# Patient Record
Sex: Female | Born: 1970 | Race: White | Hispanic: No | Marital: Married | Smoking: Former smoker
Health system: Southern US, Community
[De-identification: ages and names within clinical notes are randomized; demographics above are authoritative.]

## PROBLEM LIST (undated history)

## (undated) DIAGNOSIS — J930 Spontaneous tension pneumothorax: Secondary | ICD-10-CM

## (undated) DIAGNOSIS — H539 Unspecified visual disturbance: Secondary | ICD-10-CM

## (undated) DIAGNOSIS — G629 Polyneuropathy, unspecified: Secondary | ICD-10-CM

## (undated) DIAGNOSIS — G259 Extrapyramidal and movement disorder, unspecified: Secondary | ICD-10-CM

## (undated) DIAGNOSIS — G35 Multiple sclerosis: Secondary | ICD-10-CM

## (undated) HISTORY — PX: PLEURAL SCARIFICATION: SHX748

## (undated) HISTORY — DX: Polyneuropathy, unspecified: G62.9

## (undated) HISTORY — DX: Unspecified visual disturbance: H53.9

## (undated) HISTORY — DX: Extrapyramidal and movement disorder, unspecified: G25.9

---

## 2013-10-19 ENCOUNTER — Emergency Department (HOSPITAL_BASED_OUTPATIENT_CLINIC_OR_DEPARTMENT_OTHER): Payer: Managed Care, Other (non HMO)

## 2013-10-19 ENCOUNTER — Encounter (HOSPITAL_BASED_OUTPATIENT_CLINIC_OR_DEPARTMENT_OTHER): Payer: Self-pay | Admitting: Emergency Medicine

## 2013-10-19 ENCOUNTER — Emergency Department (HOSPITAL_BASED_OUTPATIENT_CLINIC_OR_DEPARTMENT_OTHER)
Admission: EM | Admit: 2013-10-19 | Discharge: 2013-10-19 | Disposition: A | Discharge status: Home / Self Care | Payer: Managed Care, Other (non HMO) | Attending: Emergency Medicine | Admitting: Emergency Medicine

## 2013-10-19 DIAGNOSIS — Y990 Civilian activity done for income or pay: Secondary | ICD-10-CM | POA: Insufficient documentation

## 2013-10-19 DIAGNOSIS — S52131A Displaced fracture of neck of right radius, initial encounter for closed fracture: Secondary | ICD-10-CM

## 2013-10-19 DIAGNOSIS — S52133A Displaced fracture of neck of unspecified radius, initial encounter for closed fracture: Secondary | ICD-10-CM | POA: Insufficient documentation

## 2013-10-19 DIAGNOSIS — Y939 Activity, unspecified: Secondary | ICD-10-CM | POA: Insufficient documentation

## 2013-10-19 DIAGNOSIS — W010XXA Fall on same level from slipping, tripping and stumbling without subsequent striking against object, initial encounter: Secondary | ICD-10-CM | POA: Insufficient documentation

## 2013-10-19 DIAGNOSIS — Z79899 Other long term (current) drug therapy: Secondary | ICD-10-CM | POA: Insufficient documentation

## 2013-10-19 DIAGNOSIS — W19XXXA Unspecified fall, initial encounter: Secondary | ICD-10-CM

## 2013-10-19 DIAGNOSIS — Z8669 Personal history of other diseases of the nervous system and sense organs: Secondary | ICD-10-CM | POA: Insufficient documentation

## 2013-10-19 DIAGNOSIS — Y929 Unspecified place or not applicable: Secondary | ICD-10-CM | POA: Insufficient documentation

## 2013-10-19 HISTORY — DX: Multiple sclerosis: G35

## 2013-10-19 MED ORDER — OXYCODONE-ACETAMINOPHEN 5-325 MG PO TABS: 1.0000 | ORAL_TABLET | ORAL | Status: DC | PRN

## 2013-10-19 MED ORDER — IBUPROFEN 600 MG PO TABS: 600.0000 mg | ORAL_TABLET | Freq: Four times a day (QID) | ORAL | Status: DC | PRN

## 2013-10-19 NOTE — ED Provider Notes (Signed)
Medical screening examination/treatment/procedure(s) were performed by non-physician practitioner and as supervising physician I was immediately available for consultation/collaboration.  EKG Interpretation   None         Catharine Kettlewell, MD 10/19/13 2058 

## 2013-10-19 NOTE — ED Notes (Signed)
Katherine Cameron coming out of work states she has mobility issues due to MS thinks the foot drop caused her to trip and landed on right arm having pain from elbow down

## 2013-10-19 NOTE — ED Provider Notes (Signed)
CSN: [id number]     Arrival date & time 10/19/13  1647 History   First MD Initiated Contact with Patient 10/19/13 1706     Chief Complaint  Patient presents with  . right arm pain    (Consider location/radiation/quality/duration/timing/severity/associated sxs/prior Treatment) HPI Pt is a 42yo female with hx of MS c/o right arm pain after tripping at work earlier today with outstretched hands falling from ground level onto carpeted area. States she has fallen before and believes it is due to her foot drop from MS, causing her to trip and fall.  Reports pain is intermittent, no pain at rest but 10/10 pain that is aching and throbbing with movement at her elbow and making a fist with her hand.  Denies shoulder or hand pain. Denies any other injuries. Denies numbness or tingling.  Denies previous injury of same arm.  Denies hitting head or LOC.  Past Medical History  Diagnosis Date  . Multiple sclerosis    History reviewed. No pertinent past surgical history. History reviewed. No pertinent family history. History  Substance Use Topics  . Smoking status: Never Smoker   . Smokeless tobacco: Not on file  . Alcohol Use: No   OB History   Grav Para Term Preterm Abortions TAB SAB Ect Mult Living                 Review of Systems  Musculoskeletal: Positive for arthralgias and myalgias.       Right elbow  Skin: Negative for wound.  Neurological: Negative for weakness and numbness.  All other systems reviewed and are negative.    Allergies  Review of patient's allergies indicates no known allergies.  Home Medications   Current Outpatient Rx  Name  Route  Sig  Dispense  Refill  . buPROPion (WELLBUTRIN) 75 MG tablet   Oral   Take 75 mg by mouth 2 (two) times daily.         . DULoxetine (CYMBALTA) 20 MG capsule   Oral   Take 20 mg by mouth daily.         Marland Kitchen ibuprofen (ADVIL,MOTRIN) 600 MG tablet   Oral   Take 1 tablet (600 mg total) by mouth every 6 (six) hours as needed.  30 tablet   0   . oxyCODONE-acetaminophen (PERCOCET/ROXICET) 5-325 MG per tablet   Oral   Take 1-2 tablets by mouth every 4 (four) hours as needed for severe pain.   10 tablet   0    BP 126/79  Pulse 96  Temp(Src) 97.5 F (36.4 C) (Oral)  Resp 20  Ht 5\' 5"  (1.651 m)  Wt 125 lb (56.7 kg)  BMI 20.80 kg/m2  SpO2 100%  LMP 10/12/2013 Physical Exam  Nursing note and vitals reviewed. Constitutional: She is oriented to person, place, and time. She appears well-developed and well-nourished.  HENT:  Head: Normocephalic and atraumatic.  Eyes: EOM are normal.  Neck: Normal range of motion.  Cardiovascular: Normal rate.   Pulmonary/Chest: Effort normal.  Musculoskeletal: Normal range of motion. She exhibits tenderness ( lateral aspect right elbow). She exhibits no edema.  No edema or obvious deformity of right elbow. FROM.  Pain with supination and pronation or right arm. 4/5 grip strength right hand limited by pain. Tenderness along lateral aspect of right elbow. Radial pulse 2+ Normal sensation to light touch.  Neurological: She is alert and oriented to person, place, and time.  Skin: Skin is warm and dry.  Skin in tact. No ecchymosis  or erythema.  Psychiatric: She has a normal mood and affect. Her behavior is normal.    ED Course  Procedures (including critical care time) Labs Review Labs Reviewed - No data to display Imaging Review Dg Elbow Complete Right  10/19/2013   CLINICAL DATA:  Larey Seat.  Injured right elbow.  EXAM: RIGHT ELBOW - COMPLETE 3+ VIEW  COMPARISON:  None.  FINDINGS: There is a nondisplaced radial neck fracture with mild impaction. No other fractures are identified.  IMPRESSION: Slightly impacted radial neck fracture.   Electronically Signed   By: Loralie Champagne M.D.   On: 10/19/2013 17:26    EKG Interpretation   None       MDM   1. Radial neck fracture, right, closed, initial encounter   2. Fall, initial encounter    Pt c/o right arm pain after  tripping and falling from ground level onto carpeted area with outstretched hands.  Denies hitting head or LOC. No other injuries. Pain with palpation of lateral aspect of right elbow.  Pain with pronation and supination of right arm.  Neurovascularly in tact.  Plain films: slightly impacted radial neck fracture.  Will place pt in long arm splint and sling. F/u within 1 week with Dr. Janee Morn, orthopedics for further evaluation and treatment. Rx: percocet and ibuprofen.  Return precautions provided. Pt verbalized understanding and agreement with tx plan.     Junius Finner, PA-C 10/19/13 [phone]

## 2014-07-07 DIAGNOSIS — R269 Unspecified abnormalities of gait and mobility: Secondary | ICD-10-CM | POA: Insufficient documentation

## 2014-07-07 DIAGNOSIS — G890 Central pain syndrome: Secondary | ICD-10-CM | POA: Insufficient documentation

## 2014-07-07 DIAGNOSIS — F341 Dysthymic disorder: Secondary | ICD-10-CM | POA: Insufficient documentation

## 2014-08-28 DIAGNOSIS — H469 Unspecified optic neuritis: Secondary | ICD-10-CM | POA: Insufficient documentation

## 2014-08-28 DIAGNOSIS — G35 Multiple sclerosis: Secondary | ICD-10-CM | POA: Insufficient documentation

## 2014-08-28 DIAGNOSIS — M21379 Foot drop, unspecified foot: Secondary | ICD-10-CM | POA: Insufficient documentation

## 2014-08-28 DIAGNOSIS — R200 Anesthesia of skin: Secondary | ICD-10-CM | POA: Insufficient documentation

## 2014-08-28 DIAGNOSIS — M5137 Other intervertebral disc degeneration, lumbosacral region: Secondary | ICD-10-CM | POA: Insufficient documentation

## 2014-08-28 DIAGNOSIS — M47817 Spondylosis without myelopathy or radiculopathy, lumbosacral region: Secondary | ICD-10-CM | POA: Insufficient documentation

## 2014-08-28 DIAGNOSIS — G4700 Insomnia, unspecified: Secondary | ICD-10-CM | POA: Insufficient documentation

## 2014-08-28 DIAGNOSIS — M546 Pain in thoracic spine: Secondary | ICD-10-CM | POA: Insufficient documentation

## 2014-12-16 ENCOUNTER — Ambulatory Visit (INDEPENDENT_AMBULATORY_CARE_PROVIDER_SITE_OTHER): Payer: BLUE CROSS/BLUE SHIELD | Admitting: Neurology

## 2014-12-16 ENCOUNTER — Encounter: Payer: Self-pay | Admitting: Neurology

## 2014-12-16 VITALS — BP 116/82 | HR 86 | Resp 12 | Wt 118.8 lb

## 2014-12-16 DIAGNOSIS — G35 Multiple sclerosis: Secondary | ICD-10-CM

## 2014-12-16 DIAGNOSIS — R200 Anesthesia of skin: Secondary | ICD-10-CM

## 2014-12-16 DIAGNOSIS — R39198 Other difficulties with micturition: Secondary | ICD-10-CM | POA: Insufficient documentation

## 2014-12-16 DIAGNOSIS — F32A Depression, unspecified: Secondary | ICD-10-CM

## 2014-12-16 DIAGNOSIS — H469 Unspecified optic neuritis: Secondary | ICD-10-CM

## 2014-12-16 DIAGNOSIS — R269 Unspecified abnormalities of gait and mobility: Secondary | ICD-10-CM

## 2014-12-16 DIAGNOSIS — G890 Central pain syndrome: Secondary | ICD-10-CM

## 2014-12-16 DIAGNOSIS — F329 Major depressive disorder, single episode, unspecified: Secondary | ICD-10-CM

## 2014-12-16 MED ORDER — METHYLPHENIDATE HCL 20 MG PO TABS: 20.0000 mg | ORAL_TABLET | Freq: Two times a day (BID) | ORAL | Status: DC

## 2014-12-16 NOTE — Patient Instructions (Signed)

## 2014-12-16 NOTE — Progress Notes (Addendum)
[facility]  PATIENT: Katherine Cameron DOB: 10-Apr-1971  REFERRING CLINICIAN: Hedgecock, PA-C HISTORY FROM: Patient with input form husband  REASON FOR VISIT: MS and spastic gait   HISTORICAL  CHIEF COMPLAINT:  Chief Complaint  Patient presents with  . Multiple Sclerosis    F/U MS.  Sts. taking Tecfidera as rx.  She thinks walking/balance is worse--is ambulatory with a rolling walker.  Sts. fine motor skills some worse--writing is worse.  More trouble swallowing and occasional choking episode.  Sts. bowel and bladder urgency is about the same.     HISTORY OF PRESENT ILLNESS:  Katherine Cameron is a 44 year old woman who was diagnosed with MS at age 70 when she presented with optic neuritis on the right side. She went to an ophthalmologist and then was referred to neurology where she had an MRI of the brain performed. The MRI was consistent with MS. She did not need to have a lumbar puncture. This was before FDA approved medications and she had several courses of IV steroids. Around 1994, she started Avonex after a large exacerbation around 2008, she went on Tysabri. She was more stable on Tysabri. Unfortunately, she had a a high titer JCV antibody. She was on Tysabri for about 3 years and then was switched to Tecfidera. She has been on Tecfidera for about 3 years. During the transition from Tysabri to [place], she had an especially severe exacerbation with significant worsening of her gait. A new focus was noted in the cervical spine.  Currently, her main difficulty is poor gait. Her legs are spastic. The right leg is much worse than the left leg. She has been on Ampyra for a couple years and gets a benefit with improved gait when she takes it. Missing even one dose will worsen her gait.  Another major issue is poor bladder function. She has a combination of urinary hesitancy but also has urinary frequency and urgency. She has incontinence and wears Depends. She has not seen  urology.   Vesicare and tamsulosin only helped a little bit.  She also has had bowel incontinence.    She has not tried more Myrbetriq or bethanechol. However, at this time she would prefer to hold off on further treatment of the bladder.  One of her first symptoms was optic neuritis. She continues to have decreased vision out of the left but notes that it has worsened over the past few years.   Mood is doing better. She now is only on the imipramine at bedtime. She no longer takes duloxetine or bupropion. Fatigue continues to be a problem and builds as the day goes on.   She has occasional lassitude. She takes Ritalin 20 mg once and sometimes twice a day. She feels this has helped both the mood and fatigue.  She has noted some cognitive decline.   Focus is reduced and she finds the ritalin helps some.    Her STM is decreased.   She has trouble with verbal fluency.  Planning is difficult.     She has dysesthesias in both feet. In the past, she has also had a dysesthetic sensation in the trunk. This has improved. The dysesthesias in the feet are slightly better since starting imipramine.  REVIEW OF SYSTEMS:  Constitutional: No fevers, chills, sweats, or change in appetite.  She has insomnia Eyes: No visual changes, double vision, eye pain Ear, nose and throat: No hearing loss, ear pain, nasal congestion, sore throat Cardiovascular: No chest pain, palpitations Respiratory:  No shortness of breath at rest or with exertion.   No wheezes GastrointestinaI: No nausea, vomiting, diarrhea, abdominal pain, fecal incontinence Genitourinary:  No dysuria, urinary retention or frequency.  No nocturia. Musculoskeletal:  No neck pain, back pain.  She has left > right hip pain Integumentary: No rash, pruritus, skin lesions Neurological: as above Psychiatric: No depression at this time.  No anxiety Endocrine: No palpitations, diaphoresis, change in appetite, change in weigh or increased  thirst Hematologic/Lymphatic:  No anemia, purpura, petechiae. Allergic/Immunologic: No itchy/runny eyes, nasal congestion, recent allergic reactions, rashes  ALLERGIES: No Known Allergies  HOME MEDICATIONS: Outpatient Prescriptions Prior to Visit  Medication Sig Dispense Refill  . ibuprofen (ADVIL,MOTRIN) 600 MG tablet Take 1 tablet (600 mg total) by mouth every 6 (six) hours as needed. 30 tablet 0  . buPROPion (WELLBUTRIN) 75 MG tablet Take 75 mg by mouth 2 (two) times daily.    . DULoxetine (CYMBALTA) 20 MG capsule Take 20 mg by mouth daily.    Marland Kitchen oxyCODONE-acetaminophen (PERCOCET/ROXICET) 5-325 MG per tablet Take 1-2 tablets by mouth every 4 (four) hours as needed for severe pain. (Patient not taking: Reported on 12/16/2014) 10 tablet 0   No facility-administered medications prior to visit.  also on Tecfidera 240 mg bid, imipramine 25 mg at night and Ritalin 20 mg once or twice a day  PAST MEDICAL HISTORY: Past Medical History  Diagnosis Date  . Multiple sclerosis   . Movement disorder   . Neuropathy   . Vision abnormalities     PAST SURGICAL HISTORY: Past Surgical History  Procedure Laterality Date  . Pleural scarification      FAMILY HISTORY: Family History  Problem Relation Age of Onset  . Hypertension Mother   . Hyperlipidemia Mother   . Colon cancer Mother   . COPD Father   . Multiple sclerosis Sister     SOCIAL HISTORY:  History   Social History  . Marital Status: Married    Spouse Name: N/A    Number of Children: N/A  . Years of Education: N/A   Occupational History  . Not on file.   Social History Main Topics  . Smoking status: Former Smoker    Types: Cigarettes  . Smokeless tobacco: Former Neurosurgeon    Quit date: 12/03/2014  . Alcohol Use: No  . Drug Use: No  . Sexual Activity: Yes    Birth Control/ Protection: Pill   Other Topics Concern  . Not on file   Social History Narrative     PHYSICAL EXAM  Filed Vitals:   12/16/14 1345  BP:  116/82  Pulse: 86  Resp: 12  Weight: 118 lb 12.8 oz (53.887 kg)    Body mass index is 19.77 kg/(m^2).   General: The patient is well-developed and well-nourished and in no acute distress  Eyes:  Funduscopic exam shows left optic nerve pallor.  Retinal vessels are normal.  Neck: The neck is supple, no carotid bruits are noted.  The neck is nontender.  Respiratory: The respiratory examination is clear.  Cardiovascular: The cardiovascular examination reveals a regular rate and rhythm, no murmurs, gallops or rubs are noted.  Skin: Extremities are without significant edema.  Neurologic Exam  Mental status: The patient is alert and oriented x 3 at the time of the examination. The patient has apparent normal recent and remote memory, with an apparently normal attention span and concentration ability.   Speech is normal.  Cranial nerves: Extraocular movements are full. Pupils react with a  1+ left APD.  Visual fields are full.  Color vision is decreased on the left.   Facial symmetry is present. There is good facial sensation to soft touch bilaterally.Facial strength is normal.  Trapezius and sternocleidomastoid strength is normal. No dysarthria is noted.  The tongue is midline, and the patient has symmetric elevation of the soft palate. No obvious hearing deficits are noted.  Motor:  Muscle bulk is normal and tone is increased in both legs right > left.   There is minimal right arm increased tone. Strength is  5 / 5 in arms and minimally reduced in right leg (4+/5).   Sensory: Sensory testing is intact to pinprick, soft touch, vibration sensation, and position sense on all 4 extremities.  Coordination: Cerebellar testing reveals good finger-nose-finger but mildly reduced rapid alternating movements in right hand.   Heel-to-shin is reduced bilaterally, worse on right.  Gait and station: Station is stable eyes open but she has a positive Romberg sign.    Gait is spastic and wide.   She cannot  tandem walk. Romberg is negative.   Reflexes: Deep tendon reflexes are increased, right > left. Plantar responses are normal.    DIAGNOSTIC DATA (LABS, IMAGING, TESTING) - I reviewed patient records, labs, notes, testing and imaging myself where available.  No results found for: WBC, HGB, HCT, MCV, PLT No results found for: NA, K, CL, CO2, GLUCOSE, BUN, CREATININE, CALCIUM, PROT, ALBUMIN, AST, ALT, ALKPHOS, BILITOT, GFRNONAA, GFRAA No results found for: CHOL, HDL, LDLCALC, LDLDIRECT, TRIG, CHOLHDL No results found for: ZOXW9U No results found for: VITAMINB12 No results found for: TSH No components found for: VITAMIND     ASSESSMENT AND PLAN  In summary, Katherine Cameron is a 44 year old woman with a long history of multiple sclerosis who has had progressive gait impairment over the past few years since a large exacerbation in 2013.   We had a long 60 minute office visit face-to-face with greater than 50% of the time counseling and coordinating care about her MS. 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 Therefore, it is important that she continue on a immunomodulatory therapy she is tolerating Tecfidera well and her last MRI was stable. Therefore, for the time she will continue on this therapy. We did discuss some of the pipeline drugs and some of the new drugs and I would consider a switch if she has any further changes.  I will check a CBC with differential to rule out lymphopenia as that may increase her odds of  getting PML. Additionally, I will check a vitamin D as low vitamin D has been associated with worsening prognosis in MS.  Work has become much more difficult for her. Her job has allowed her to work from home as she has had progressive gait difficulties and was falling quite a bit while on her job. However, she is noticing some symptoms in the right arm and typing is becoming more difficult. Also she has a visual blurring out of the  left eye that makes focusing on a computer screen for long period of time difficult.  Furthermore, she has had more difficulty with fatigue and cognitive dysfunction. I am concerned that she may not be able to continue to work. That her quantify her cognitive difficulties I will order a neurocognitive evaluation by a neuropsychologist.  She will return to see me in about 3 months or sooner if she has any new or worsening neurologic dysfunction.  Katherine Cameron  Aida Puffer, MD, PhD 12/16/2014, 2:25 PM Certified in Neurology, Clinical Neurophysiology, Sleep Medicine, Pain Medicine and Neuroimaging  [facility] [address], [address] [place], [place] [address] [phone]

## 2014-12-17 ENCOUNTER — Telehealth: Payer: Self-pay | Admitting: *Deleted

## 2014-12-17 LAB — CBC WITH DIFFERENTIAL/PLATELET
Basophils Absolute: 0 10*3/uL (ref 0.0–0.2)
Basos: 0 %
Eos: 2 %
Eosinophils Absolute: 0.2 10*3/uL (ref 0.0–0.4)
HCT: 43.9 % (ref 34.0–46.6)
Hemoglobin: 14.9 g/dL (ref 11.1–15.9)
Immature Grans (Abs): 0 10*3/uL (ref 0.0–0.1)
Immature Granulocytes: 0 %
Lymphocytes Absolute: 2 10*3/uL (ref 0.7–3.1)
Lymphs: 23 %
MCH: 31 pg (ref 26.6–33.0)
MCHC: 33.9 g/dL (ref 31.5–35.7)
MCV: 91 fL (ref 79–97)
Monocytes Absolute: 0.6 10*3/uL (ref 0.1–0.9)
Monocytes: 7 %
Neutrophils Absolute: 5.9 10*3/uL (ref 1.4–7.0)
Neutrophils Relative %: 68 %
RBC: 4.81 x10E6/uL (ref 3.77–5.28)
RDW: 13.1 % (ref 12.3–15.4)
WBC: 8.7 10*3/uL (ref 3.4–10.8)

## 2014-12-17 LAB — VITAMIN D 25 HYDROXY (VIT D DEFICIENCY, FRACTURES): Vit D, 25-Hydroxy: 60.4 ng/mL (ref 30.0–100.0)

## 2014-12-17 NOTE — Telephone Encounter (Signed)
Spoke with Glennys and per RAS, advised her that labs from 1-14 ov are ok; she should continue meds as rx.  She verbalized understanding of same/fim

## 2014-12-29 ENCOUNTER — Telehealth: Payer: Self-pay | Admitting: *Deleted

## 2014-12-29 NOTE — Telephone Encounter (Signed)
Patient husband calling wanting a call back. He states that he did not want to leave a message he wants to talk to [facility] personally.

## 2014-12-30 NOTE — Telephone Encounter (Signed)
Spoke with [name].  RAS referred Neenah to [facility] on 12-16-14, but they have not heard from CBM to sched an appt.  I confirmed that referral was electronically submitted.  I spoke with CBM and was told delay may be due to their scheduler having fallen and has been out of work for several days.  To be sure, I spoke with [place], and she submitted referral again, this time manually.  I advised [name] of above details--he verbalized understanding of same and will let me know if he still does not receive a call to sched appt in the next several business days/fim

## 2015-01-06 ENCOUNTER — Telehealth: Payer: Self-pay | Admitting: *Deleted

## 2015-01-06 ENCOUNTER — Telehealth: Payer: Self-pay | Admitting: Neurology

## 2015-01-06 ENCOUNTER — Other Ambulatory Visit: Payer: Self-pay | Admitting: *Deleted

## 2015-01-06 DIAGNOSIS — G35 Multiple sclerosis: Secondary | ICD-10-CM

## 2015-01-06 DIAGNOSIS — R260 Ataxic gait: Secondary | ICD-10-CM

## 2015-01-06 MED ORDER — MIRABEGRON ER 50 MG PO TB24: 50.0000 mg | ORAL_TABLET | Freq: Every day | ORAL | Status: DC

## 2015-01-06 NOTE — Telephone Encounter (Signed)
Per RAS, ok for Myrbetriq 50mg  daily.  Symphany is agreeable with this plan.  Rx. escribed to [facility] per her request/fim

## 2015-01-06 NOTE — Telephone Encounter (Signed)
Pt is calling to request a Rx for bladder control from Dr. Epimenio Foot.  She said he mentioned putting her on it at her last visit.  She is not sure of what it is called. Please call and advise.

## 2015-03-13 ENCOUNTER — Other Ambulatory Visit: Payer: Self-pay | Admitting: Neurology

## 2015-04-11 ENCOUNTER — Telehealth: Payer: Self-pay | Admitting: Neurology

## 2015-04-11 NOTE — Telephone Encounter (Signed)
[name], pt's husband called wanting to confirm if Dr, Epimenio Foot can fill out a form in order for the patient to have a stair lift at home.  Please call and advice # [phone]

## 2015-04-11 NOTE — Telephone Encounter (Signed)
I have spoken with Jeff--he is on his way to the office now to drop off form/fim

## 2015-04-12 ENCOUNTER — Telehealth: Payer: Self-pay | Admitting: Neurology

## 2015-04-12 NOTE — Telephone Encounter (Signed)
I have spoken with [facility] and advised that per RAS, she may be a candidate for [place]--I offered an appt. with RAS to discuss further, sign start form if she decides to proceed.  She verbalized understanding of same--would like to think some more about this, and will call me back if she wants a sooner appt./fim

## 2015-04-12 NOTE — Telephone Encounter (Signed)
I have spoken with Katherine Cameron this morning--she c/o continued worsening of gait/balance. Has already fallen 4 times today.  Currently is on Tecfidera and tolerates it well, but would like to consider a possible change in MS therapy, would like to know what RAS would recommend.  I will speak with RAS and call her back today/fim

## 2015-04-12 NOTE — Telephone Encounter (Signed)
Patient is calling because she has been falling more frequently, 4 times today. She states she has been taking Tecfidera for about 3 years and wonders if the dosage or medication needs to be changed or if she needs to come in to see Dr Epimenio Foot.  Please call.  Thanks!

## 2015-04-12 NOTE — Telephone Encounter (Signed)
[name] brought paperwork in yesterday--they are trying to get funding from the [employer] to help pay for a stair lift--RAS signed and paperwork was given directly back to Jeff/fim

## 2015-04-19 ENCOUNTER — Ambulatory Visit: Payer: BLUE CROSS/BLUE SHIELD | Admitting: Neurology

## 2015-04-29 ENCOUNTER — Encounter: Payer: Self-pay | Admitting: Neurology

## 2015-04-29 ENCOUNTER — Ambulatory Visit (INDEPENDENT_AMBULATORY_CARE_PROVIDER_SITE_OTHER): Payer: BLUE CROSS/BLUE SHIELD | Admitting: Neurology

## 2015-04-29 VITALS — BP 112/70 | HR 76 | Resp 14 | Ht 65.0 in | Wt 118.0 lb

## 2015-04-29 DIAGNOSIS — R4189 Other symptoms and signs involving cognitive functions and awareness: Secondary | ICD-10-CM | POA: Insufficient documentation

## 2015-04-29 DIAGNOSIS — H469 Unspecified optic neuritis: Secondary | ICD-10-CM | POA: Diagnosis not present

## 2015-04-29 DIAGNOSIS — R269 Unspecified abnormalities of gait and mobility: Secondary | ICD-10-CM

## 2015-04-29 DIAGNOSIS — M21371 Foot drop, right foot: Secondary | ICD-10-CM

## 2015-04-29 DIAGNOSIS — R200 Anesthesia of skin: Secondary | ICD-10-CM

## 2015-04-29 DIAGNOSIS — G35 Multiple sclerosis: Secondary | ICD-10-CM | POA: Diagnosis not present

## 2015-04-29 DIAGNOSIS — N399 Disorder of urinary system, unspecified: Secondary | ICD-10-CM

## 2015-04-29 DIAGNOSIS — R5383 Other fatigue: Secondary | ICD-10-CM | POA: Insufficient documentation

## 2015-04-29 DIAGNOSIS — F329 Major depressive disorder, single episode, unspecified: Secondary | ICD-10-CM | POA: Diagnosis not present

## 2015-04-29 DIAGNOSIS — F32A Depression, unspecified: Secondary | ICD-10-CM

## 2015-04-29 MED ORDER — TAMSULOSIN HCL 0.4 MG PO CAPS: 0.4000 mg | ORAL_CAPSULE | Freq: Every day | ORAL | Status: DC

## 2015-04-29 MED ORDER — METHYLPHENIDATE HCL 20 MG PO TABS: 20.0000 mg | ORAL_TABLET | Freq: Two times a day (BID) | ORAL | Status: DC

## 2015-04-29 MED ORDER — VITAMIN D3 125 MCG (5000 UT) PO CAPS: 1.0000 | ORAL_CAPSULE | Freq: Every day | ORAL | Status: DC

## 2015-04-29 NOTE — Progress Notes (Signed)
[facility]  PATIENT: Katherine Cameron DOB: 1971-06-05  REFERRING CLINICIAN: Hedgecock, PA-C HISTORY FROM: Patient with input form husband  REASON FOR VISIT: MS and spastic gait   HISTORICAL  CHIEF COMPLAINT:  Chief Complaint  Patient presents with  . Multiple Sclerosis    Sts. she tolerates Tecfidera well.  Sts. has good days and bad with walking.  She is using a rolling walker, also thinks Ampyra helps.  She would like to discuss results of neurocognitive testing./fim    HISTORY OF PRESENT ILLNESS:  Katherine Cameron is a 44 year old woman who was diagnosed with MS at age 23.    She continue to have a lot of difficulty with gait but feels she is mostly stable.      Gait/strength/sensation:   Right leg is much weaker and more spastic then the left.   Gait is spastic.    She fell 3-4 times one day but sheis not sure why.   Even is she misses one dose of Ampyra she notes she does much worse.   She uses a rollator but her foot drags a lot, still.     Due to falling at work she has been working from home more recently. She does not have to walk as much as she used to. She had multiple falls at work including one where she had up breaking her right elbow. The dysesthesias  are better since starting imipramine.  Bladder function:   She has a combination of urinary hesitancy but also has urinary frequency and urgency.  She has incontinence out of the house but nit in the house.   She wears Depends out of the house. She has not seen urology.   Imipramine helps better than Vesicare and she could not afford Myrbetriq.   Tamsulosin helped but she has not tried combo with imiprmaine  She also has had bowel incontinence.   We also talked about botox.    She gets anxious when she is out of the house because of the bladder function.     Vision:    She has decreased vision out of the left since ON an it has slightly worsend over past few years.     Fatigue/sleep:   Fatigue continues to be a  problem and builds as the day goes on.   She has lassitude some days. She takes Ritalin 20 mg once and sometimes twice a day. She feels this has helped both the mood and fatigue.    Taking bid causes insomnia some days  Mood/cognition:  Mood is doing better on imipramine than on duloxetine or bupropion.    She has noted some cognitive decline.   Focus is reduced and she finds the ritalin helps some.    Her STM is decreased.   She has trouble with verbal fluency.  Planning is difficult.  She notes difficulty doing some of her tasks for work.   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 The depression was not felt to be playing a major role in her cognition deficits.  Due to the combination of her's ago and cognitive impairments, she may need to working if she continues to have more difficulty.  MS History:  She presented with right optic neuritis. She went to an ophthalmologist and then was referred to neurology where she had an MRI of the brain performed. The MRI was consistent  with MS. She did not need to have a lumbar puncture. This was before FDA approved medications and she had several courses of IV steroids. Around 1994, she started Avonex after a large exacerbation around 2008, she went on Tysabri. She was more stable on Tysabri. Unfortunately, she had a a high titer JCV antibody. She was on Tysabri for about 3 years and then was switched to Tecfidera. She has been on Tecfidera for about 3 years. During the transition from Tysabri to [place], she had an especially severe exacerbation with significant worsening of her gait. A new focus was noted in the cervical spine.    REVIEW OF SYSTEMS:  Constitutional: No fevers, chills, sweats, or change in appetite.  She has insomnia Eyes: No visual changes, double vision, eye pain Ear, nose and throat: No  hearing loss, ear pain, nasal congestion, sore throat Cardiovascular: No chest pain, palpitations Respiratory:  No shortness of breath at rest or with exertion.   No wheezes GastrointestinaI: No nausea, vomiting, diarrhea, abdominal pain, fecal incontinence Genitourinary:  No dysuria, urinary retention or frequency.  No nocturia. Musculoskeletal:  No neck pain, back pain.  She has left > right hip pain Integumentary: No rash, pruritus, skin lesions Neurological: as above Psychiatric: No depression at this time.  No anxiety Endocrine: No palpitations, diaphoresis, change in appetite, change in weigh or increased thirst Hematologic/Lymphatic:  No anemia, purpura, petechiae. Allergic/Immunologic: No itchy/runny eyes, nasal congestion, recent allergic reactions, rashes  ALLERGIES: No Known Allergies  HOME MEDICATIONS: Outpatient Prescriptions Prior to Visit  Medication Sig Dispense Refill  . AMPYRA 10 MG TB12 TAKE ONE TABLET TWICE DAILY 180 tablet 2  . Dimethyl Fumarate 240 MG CPDR Take 240 mg by mouth 2 (two) times daily.    Marland Kitchen ibuprofen (ADVIL,MOTRIN) 600 MG tablet Take 1 tablet (600 mg total) by mouth every 6 (six) hours as needed. 30 tablet 0  . imipramine (TOFRANIL) 25 MG tablet Take 25 mg by mouth at bedtime.    . methylphenidate (RITALIN) 20 MG tablet Take 1 tablet (20 mg total) by mouth 2 (two) times daily with breakfast and lunch. 60 tablet 0  . buPROPion (WELLBUTRIN) 75 MG tablet Take 75 mg by mouth 2 (two) times daily.    . DULoxetine (CYMBALTA) 20 MG capsule Take 20 mg by mouth daily.    . mirabegron ER (MYRBETRIQ) 50 MG TB24 tablet Take 1 tablet (50 mg total) by mouth daily. 30 tablet 11  . oxyCODONE-acetaminophen (PERCOCET/ROXICET) 5-325 MG per tablet Take 1-2 tablets by mouth every 4 (four) hours as needed for severe pain. (Patient not taking: Reported on 12/16/2014) 10 tablet 0   No facility-administered medications prior to visit.  also on Tecfidera 240 mg bid, imipramine 25  mg at night and Ritalin 20 mg once or twice a day  PAST MEDICAL HISTORY: Past Medical History  Diagnosis Date  . Multiple sclerosis   . Movement disorder   . Neuropathy   . Vision abnormalities     PAST SURGICAL HISTORY: Past Surgical History  Procedure Laterality Date  . Pleural scarification      FAMILY HISTORY: Family History  Problem Relation Age of Onset  . Hypertension Mother   . Hyperlipidemia Mother   . Colon cancer Mother   . COPD Father   . Multiple sclerosis Sister     SOCIAL HISTORY:  History   Social History  . Marital Status: Married    Spouse Name: N/A  . Number of Children: N/A  . Years  of Education: N/A   Occupational History  . Not on file.   Social History Main Topics  . Smoking status: Former Smoker    Types: Cigarettes  . Smokeless tobacco: Former Neurosurgeon    Quit date: 12/03/2014  . Alcohol Use: No  . Drug Use: No  . Sexual Activity: Yes    Birth Control/ Protection: Pill   Other Topics Concern  . Not on file   Social History Narrative     PHYSICAL EXAM  Filed Vitals:   04/29/15 1110  BP: 112/70  Pulse: 76  Resp: 14  Height:  (1.651 m)  Weight: 118 lb (53.524 kg)    Body mass index is 19.64 kg/(m^2).   General: The patient is well-developed and well-nourished and in no acute distress  Skin: Extremities are without significant edema.  Neurologic Exam  Mental status: The patient is alert and oriented x 3 at the time of the examination. The patient has apparent normal recent and remote memory, with an apparently normal attention span and concentration ability.   Speech is normal.  Cranial nerves: Extraocular movements are full. Pupils react with a 1+ left APD.  Visual fields are full.  Color vision is decreased on the left.   Facial symmetry is present. There is good facial sensation to soft touch bilaterally.Facial strength is normal.  Trapezius and sternocleidomastoid strength is normal. No dysarthria is noted.  The  tongue is midline, and the patient has symmetric elevation of the soft palate. No obvious hearing deficits are noted.  Motor:  Muscle bulk is normal and tone is increased in both legs right > left.   There is minimal right arm increased tone. Strength is  5 / 5 in arms and minimally reduced in right leg (4+/5).   Sensory: Sensory testing is intact to touch and vibration sensation in all 4 extremities.  Coordination: Cerebellar testing reveals good finger-nose-finger but mildly reduced rapid alternating movements in right hand.   Heel-to-shin is reduced bilaterally, worse on right.  Gait and station: Station is stable eyes open but she has a positive Romberg sign.    Gait is spastic and wide.   She cannot tandem walk.   Reflexes: Deep tendon reflexes are increased, right > left.     DIAGNOSTIC DATA (LABS, IMAGING, TESTING) - I reviewed patient records, labs, notes, testing and imaging myself where available.  Lab Results  Component Value Date   WBC 8.7 12/16/2014   HGB 14.9 12/16/2014   HCT 43.9 12/16/2014   MCV 91 12/16/2014       ASSESSMENT AND PLAN  DS (disseminated sclerosis)  Anterior optic neuritis  Disease of urinary tract  Loss of feeling or sensation  Right foot drop  Clinical depression  Abnormal gait  Disturbed cognition  Other fatigue    1.   Continue Tecfidera 2.  We discussed changing the Ritalin to 20 mg in the morning and 10 mg 4 hours later. We'll help her attentional and fatigue related issues. At time but if she just takes 1 pill. 3.   She will try combination of imipramine with tamsulosin for the bladder.  4.   Work has become much more difficult for her due to a combination of physical and cognitive impairments.   Working from home instead of work has helped some (she had multiple falls including a fracture at work) and has probably allowed her to continue to work as long as she has but she continues to have difficulty and her  cognitive  impairment has worsened.   We had a long 50 minute office visit face-to-face with greater than 50% of the time counseling and coordinating care about her MS.   She will return to see me in about 4 months or sooner if she has any new or worsening neurologic dysfunction.  Richard A. Epimenio Foot, MD, PhD 04/29/2015, 11:29 AM Certified in Neurology, Clinical Neurophysiology, Sleep Medicine, Pain Medicine and Neuroimaging  [facility] [address], [address] [place], [place] [address] [phone]

## 2015-05-09 ENCOUNTER — Other Ambulatory Visit: Payer: Self-pay | Admitting: Neurology

## 2015-05-12 ENCOUNTER — Other Ambulatory Visit: Payer: Self-pay

## 2015-05-12 ENCOUNTER — Telehealth: Payer: Self-pay | Admitting: *Deleted

## 2015-05-12 MED ORDER — VITAMIN D3 125 MCG (5000 UT) PO CAPS: 1.0000 | ORAL_CAPSULE | Freq: Every day | ORAL | Status: DC

## 2015-05-12 MED ORDER — METHYLPHENIDATE HCL 20 MG PO TABS: 20.0000 mg | ORAL_TABLET | Freq: Two times a day (BID) | ORAL | Status: DC

## 2015-05-12 NOTE — Telephone Encounter (Signed)
Requests have been entered, forwarded to provider to approve so Rx's will reprint.  Thank you.

## 2015-05-12 NOTE — Telephone Encounter (Signed)
Ok to print out again

## 2015-05-12 NOTE — Telephone Encounter (Signed)
Request has been entered, forwarded to provider for

## 2015-05-12 NOTE — Telephone Encounter (Signed)
Patient is unable to locate Rx given at [facility].

## 2015-05-13 NOTE — Telephone Encounter (Signed)
LMOM that rx's are available to be picked up today from 8am-12noon.  Shakerria/Jeff do not need to return this call unless they have questions/fim

## 2015-05-26 ENCOUNTER — Telehealth: Payer: Self-pay | Admitting: Neurology

## 2015-05-26 NOTE — Telephone Encounter (Signed)
I have spoken with Katherine Cameron this afternoon and per RAS, advised that she does not need rx. for Biotin, that the best source for this is [employer], that the rec. dose is 100mg  tid, and that the approx. cost is $60 per month.  Katherine Cameron verbalized understanding of same/fim

## 2015-05-26 NOTE — Telephone Encounter (Signed)
Patient called and requested to speak with Faith RN regarding medication Rx. BIOTIN. She would like to know if Dr. Epimenio Foot would write her a rx for the medication. Please call and advise.

## 2015-06-02 ENCOUNTER — Encounter: Payer: Self-pay | Admitting: *Deleted

## 2015-06-10 ENCOUNTER — Telehealth: Payer: Self-pay | Admitting: Neurology

## 2015-06-10 NOTE — Telephone Encounter (Signed)
Patient called stating she has new insurance [employer]selling [employer]) that will be calling today to give authorization for Ampyra. She is giving [place]. Patient can be reached at [phone].

## 2015-06-12 MED ORDER — AMPYRA 10 MG PO TB12: 10.0000 mg | ORAL_TABLET | Freq: Two times a day (BID) | ORAL | Status: DC

## 2015-06-14 ENCOUNTER — Telehealth: Payer: Self-pay | Admitting: Neurology

## 2015-06-14 NOTE — Telephone Encounter (Signed)
Ampyra Rx was sent to [facility] on 07/10 (they did confirm receipt).  I called them to clarify if any additional info was needed.  Spoke with [employer].  He said by viewing the file, they have everything they need.  Says they are still processing the order, but nothing further is required from [place].  I called the patient back.  Relayed info provided by [employer].  She expressed understanding.

## 2015-06-14 NOTE — Telephone Encounter (Signed)
Pt calling stating that [employer] faxed over a form to be filled so the pt can have her meds delivered. May call pt [phone] .

## 2015-06-22 ENCOUNTER — Telehealth: Payer: Self-pay | Admitting: Neurology

## 2015-06-22 NOTE — Telephone Encounter (Signed)
I have spoken with Katherine Cameron this afternoon and per RAS, advised it is ok to take Biotin and Alphalipoic acid together./fim

## 2015-06-22 NOTE — Telephone Encounter (Signed)
Pt called and wants to know if its safe to take biotin and alphalitoic together. Please call and advise [phone]

## 2015-06-24 NOTE — Telephone Encounter (Signed)
error 

## 2015-07-19 ENCOUNTER — Telehealth: Payer: Self-pay | Admitting: Neurology

## 2015-07-19 DIAGNOSIS — G35 Multiple sclerosis: Secondary | ICD-10-CM

## 2015-07-19 DIAGNOSIS — R260 Ataxic gait: Secondary | ICD-10-CM

## 2015-07-19 NOTE — Telephone Encounter (Signed)
Pt called and has a question regarding treatment- Wants to know if she should have another treatment or a different type of treatment. She has become more wheelchair bound than normal. Please call and advise [phone]

## 2015-07-19 NOTE — Telephone Encounter (Signed)
I have spoken with Katherine Cameron this morning.  She sts. despite compliance with Tecfidera, also taking high dose Biotin and alpha lipoic acid, she is having more difficulty walking.  She now has to use her electric scooter whenever she leaves her house.  Per RAS, ok for order to check mri brain and c-spine, both with and without contrast. I have ordered these.  Katherine Cameron would like them scheduled wherever she can have them the quickest.  I have also requested old mri's on cd from [facility] for comparison/fim

## 2015-07-20 NOTE — Telephone Encounter (Signed)
Patient called and states she has been waiting for someone to call her to schedule MRI. She is anxious to get this scheduled.

## 2015-07-21 NOTE — Telephone Encounter (Signed)
noted/fim 

## 2015-07-21 NOTE — Telephone Encounter (Signed)
I spoke with patient this morning letting her know that her insurance [employer] has approved both MRI and that [facility] has her order. I gave her [facility] phone# for her To schedule her appointment. Patient advised me that she will call today and schedule.

## 2015-07-29 ENCOUNTER — Ambulatory Visit
Admission: RE | Admit: 2015-07-29 | Discharge: 2015-07-29 | Disposition: A | Discharge status: Home / Self Care | Payer: Managed Care, Other (non HMO) | Source: Ambulatory Visit | Attending: Neurology | Admitting: Neurology

## 2015-07-29 DIAGNOSIS — G35 Multiple sclerosis: Secondary | ICD-10-CM

## 2015-07-29 DIAGNOSIS — R260 Ataxic gait: Secondary | ICD-10-CM

## 2015-07-29 MED ORDER — GADOBENATE DIMEGLUMINE 529 MG/ML IV SOLN
10.0000 mL | Freq: Once | INTRAVENOUS | Status: AC | PRN
  Administered 2015-07-29: 10 mL via INTRAVENOUS

## 2015-08-09 ENCOUNTER — Telehealth: Payer: Self-pay | Admitting: *Deleted

## 2015-08-09 NOTE — Telephone Encounter (Signed)
-----   Message from [name], MD sent at 08/06/2015  2:51 PM EDT ----- Please let her know that the MRIs (brain and cervical spine) do not show any brand-new lesions. We will need to get the old films from [facility] to compare

## 2015-08-09 NOTE — Telephone Encounter (Signed)
I have spoken with Katherine Cameron and per RAS, advised that recent mri brain/neck did not show any brand new lesions, that I have requested old mri's from [facility] for comparison.  She verbalized understanding of same/fim

## 2015-08-11 ENCOUNTER — Encounter: Payer: Self-pay | Admitting: Neurology

## 2015-08-11 ENCOUNTER — Ambulatory Visit (INDEPENDENT_AMBULATORY_CARE_PROVIDER_SITE_OTHER): Payer: Managed Care, Other (non HMO) | Admitting: Neurology

## 2015-08-11 ENCOUNTER — Telehealth: Payer: Self-pay | Admitting: *Deleted

## 2015-08-11 VITALS — BP 104/76 | HR 88 | Resp 16 | Ht 65.0 in | Wt 118.0 lb

## 2015-08-11 DIAGNOSIS — R4189 Other symptoms and signs involving cognitive functions and awareness: Secondary | ICD-10-CM | POA: Diagnosis not present

## 2015-08-11 DIAGNOSIS — G35 Multiple sclerosis: Secondary | ICD-10-CM

## 2015-08-11 DIAGNOSIS — F329 Major depressive disorder, single episode, unspecified: Secondary | ICD-10-CM | POA: Diagnosis not present

## 2015-08-11 DIAGNOSIS — R269 Unspecified abnormalities of gait and mobility: Secondary | ICD-10-CM

## 2015-08-11 DIAGNOSIS — R200 Anesthesia of skin: Secondary | ICD-10-CM

## 2015-08-11 DIAGNOSIS — R5383 Other fatigue: Secondary | ICD-10-CM

## 2015-08-11 DIAGNOSIS — F32A Depression, unspecified: Secondary | ICD-10-CM

## 2015-08-11 MED ORDER — VITAMIN D3 125 MCG (5000 UT) PO CAPS: 1.0000 | ORAL_CAPSULE | Freq: Every day | ORAL | Status: AC

## 2015-08-11 MED ORDER — METHYLPHENIDATE HCL 20 MG PO TABS: 20.0000 mg | ORAL_TABLET | Freq: Two times a day (BID) | ORAL | Status: DC

## 2015-08-11 NOTE — Progress Notes (Signed)
[facility]  PATIENT: Katherine Cameron DOB: 04-Jul-1971  REFERRING CLINICIAN: Hedgecock, PA-C HISTORY FROM: Patient with input form husband  REASON FOR VISIT: MS and spastic gait   HISTORICAL  CHIEF COMPLAINT:  Chief Complaint  Patient presents with  . Multiple Sclerosis    Sts. she continues to tolerate Tecfidera well.  Sts. despite no new lesions noted on mri, she continues to  have progression of sx.  Sts. gait/balance, extremity weakness, depression are all worse.  She is tearful today.  Due to frequent falls, she now uses an electric scooter even at  home/fim  . Gait Problem    HISTORY OF PRESENT ILLNESS:  Katherine Cameron is a 44 year old woman who was diagnosed with MS at age 44.    She is having more difficulty with gait and now needs scooter almost continually.   She started Biotin 300 mg but it has not helped any.   She tolerates it well.      MRI of the brain and spinal cord were performed last week and I reviewed the images in their presence    We will try to get her last films from [facility] for comparison.  Gait/strength/sensation:   Right leg is much weaker and she no longer uses the Rollator.   She can only take a few steps with the walker and now is relying on her scooter.    She is on Ampyra and does worse if she misses a dose.    Due to falling at work she has been working from home more recently.   The dysesthesias are better since starting imipramine.   Neurontin had not helped.   Lyrica helped a little bit.    Bladder function:   She is doing a little better since starting biotin.    has a combination of urinary hesitancy but also has urinary frequency and urgency.  She has incontinence out of the house but nit in the house.   She wears Depends out of the house. She has not seen urology.   Imipramine helps better than Vesicare and she could not afford Myrbetriq.   Tamsulosin helped but she has not tried combo with imiprmaine  She also has had bowel  incontinence.   We also talked about botox.    She gets anxious when she is out of the house because of the bladder function.     Vision:    She has decreased vision out of the left since ON but it is stable  Fatigue/sleep:   Fatigue bothers her most days.      She takes Ritalin 20 mg once and sometimes twice a day. She usually only takes if she has a meeting. king bid causes insomnia.   Mood/cognition:  Mood is worse and she has a lot of crying spells. She stopped duloxetine a couple years ago and was once on Prozac but did not like the way that she felt.Marland Kitchen   She was dong better earlier this year.    She has noted some cognitive decline.   She has reduced focus, STM and she has trouble with verbal fluency.  She notes difficulty doing some of her tasks for work.   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   Due to the combination of her's ago and cognitive impairments, she may need to stop working if she continues to have more  difficulty.  MS History:  She presented with right optic neuritis. She went to an ophthalmologist and then was referred to neurology where she had an MRI of the brain performed. The MRI was consistent with MS. She did not need to have a lumbar puncture. This was before FDA approved medications and she had several courses of IV steroids. Around 1994, she started Avonex after a large exacerbation around 2008, she went on Tysabri. She was more stable on Tysabri. Unfortunately, she had a a high titer JCV antibody. She was on Tysabri for about 3 years and then was switched to Tecfidera. She has been on Tecfidera for about 3 years. During the transition from Tysabri to [place], she had an especially severe exacerbation with significant worsening of her gait. A new focus was noted in the cervical spine.      REVIEW OF SYSTEMS:    Constitutional: No fevers, chills, sweats, or change in appetite.  She has insomnia Eyes: No visual changes, double vision, eye pain Ear, nose and throat: No hearing loss, ear pain, nasal congestion, sore throat Cardiovascular: No chest pain, palpitations Respiratory:  No shortness of breath at rest or with exertion.   No wheezes GastrointestinaI: No nausea, vomiting, diarrhea, abdominal pain, fecal incontinence Genitourinary:  No dysuria, urinary retention or frequency.  No nocturia. Musculoskeletal:  No neck pain, back pain.  She has left > right hip pain Integumentary: No rash, pruritus, skin lesions Neurological: as above Psychiatric: No depression at this time.  No anxiety Endocrine: No palpitations, diaphoresis, change in appetite, change in weigh or increased thirst Hematologic/Lymphatic:  No anemia, purpura, petechiae. Allergic/Immunologic: No itchy/runny eyes, nasal congestion, recent allergic reactions, rashes  ALLERGIES: No Known Allergies  HOME MEDICATIONS: Outpatient Prescriptions Prior to Visit  Medication Sig Dispense Refill  . AMPYRA 10 MG TB12 Take 1 tablet (10 mg total) by mouth 2 (two) times daily. 180 tablet 2  . Cholecalciferol (VITAMIN D3) 5000 UNITS CAPS Take 1 capsule (5,000 Units total) by mouth daily. 90 capsule 3  . co-enzyme Q-10 50 MG capsule Take 50 mg by mouth daily.    . Docosahexaenoic Acid (DHA PO) Take by mouth.    . EVENING PRIMROSE OIL PO Take by mouth.    Marland Kitchen ibuprofen (ADVIL,MOTRIN) 600 MG tablet Take 1 tablet (600 mg total) by mouth every 6 (six) hours as needed. 30 tablet 0  . methylphenidate (RITALIN) 20 MG tablet Take 1 tablet (20 mg total) by mouth 2 (two) times daily with breakfast and lunch. 60 tablet 0  . tamsulosin (FLOMAX) 0.4 MG CAPS capsule Take 1 capsule (0.4 mg total) by mouth daily. 90 capsule 3  . TECFIDERA 240 MG CPDR TAKE 1 CAPSULE TWICE DAILY 180 capsule 3  . imipramine (TOFRANIL) 25 MG tablet Take 25 mg by mouth at bedtime.      No facility-administered medications prior to visit.  also on Tecfidera 240 mg bid, imipramine 25 mg at night and Ritalin 20 mg once or twice a day  PAST MEDICAL HISTORY: Past Medical History  Diagnosis Date  . Multiple sclerosis   . Movement disorder   . Neuropathy   . Vision abnormalities     PAST SURGICAL HISTORY: Past Surgical History  Procedure Laterality Date  . Pleural scarification      FAMILY HISTORY: Family History  Problem Relation Age of Onset  . Hypertension Mother   . Hyperlipidemia Mother   . Colon cancer Mother   . COPD Father   . Multiple sclerosis  Sister     SOCIAL HISTORY:  Social History   Social History  . Marital Status: Married    Spouse Name: N/A  . Number of Children: N/A  . Years of Education: N/A   Occupational History  . Not on file.   Social History Main Topics  . Smoking status: Former Smoker    Types: Cigarettes  . Smokeless tobacco: Former Neurosurgeon    Quit date: 12/03/2014  . Alcohol Use: No  . Drug Use: No  . Sexual Activity: Yes    Birth Control/ Protection: Pill   Other Topics Concern  . Not on file   Social History Narrative     PHYSICAL EXAM  Filed Vitals:   08/11/15 1553  BP: 104/76  Pulse: 88  Resp: 16  Height: 5\' 5"  (1.651 m)  Weight: 118 lb (53.524 kg)    Body mass index is 19.64 kg/(m^2).   General: The patient is well-developed and well-nourished and in no acute distress   Neurologic Exam  Mental status: The patient is alert and oriented x 3 at the time of the examination. The patient has apparent normal recent and remote memory, with an apparently normal attention span and concentration ability.   Speech is normal.  Cranial nerves: Extraocular movements are full.   Facial symmetry is present. There is good facial sensation to soft touch bilaterally.Facial strength is normal.  Trapezius and sternocleidomastoid strength is normal. No dysarthria is noted.  The tongue is midline, and the patient has  symmetric elevation of the soft palate. No obvious hearing deficits are noted.  Motor:  Muscle bulk is normal and tone is increased in both legs right > left.   There is minimal right arm increased tone. Strength is  5 / 5 in arms and minimally reduced in right leg (4+/5).   Sensory: Sensory testing is intact to touch and vibration sensation in all 4 extremities.  Coordination: Cerebellar testing reveals good finger-nose-finger but mildly reduced rapid alternating movements in right hand.   Heel-to-shin is reduced bilaterally, worse on right.  Gait and station: Station is unstable.    Gait is spastic and requires support.     Reflexes: Deep tendon reflexes are increased, right > left.     DIAGNOSTIC DATA (LABS, IMAGING, TESTING) - I reviewed patient records, labs, notes, testing and imaging myself where available.  Lab Results  Component Value Date   WBC 8.7 12/16/2014   HGB 14.9 12/16/2014   HCT 43.9 12/16/2014   MCV 91 12/16/2014       ASSESSMENT AND PLAN  Multiple sclerosis - Plan: CBC with Differential/Platelet  DS (disseminated sclerosis)  Other fatigue  Loss of feeling or sensation  Abnormal gait  Clinical depression  Disturbed cognition     1.   Continue Tecfidera for now.   Check CBC with differential to assess for lymphopenia. If present, consider switching medications sooner.   We discussed Rituxan, ocrelizumab and Lemtrada as alternatives to Tysabri. She is interested in switching to one of these.   She would like to give this more thought before making a decision. 2.  Continue  Ritalin .  New script.    I discussed an antidepressant but she was concerned that she lost her emotional range when she was placed on one previously. 3.   She will return to see me in about 4 months or sooner if she has any new or worsening neurologic dysfunction.   We had a long 50 minute office visit face-to-face  with greater than 50% of the time counseling and coordinating  care about her MS.     Richard A. Epimenio Foot, MD, PhD 08/11/2015, 4:04 PM Certified in Neurology, Clinical Neurophysiology, Sleep Medicine, Pain Medicine and Neuroimaging  [facility] [address], [address], [place] [address] [phone]

## 2015-08-11 NOTE — Telephone Encounter (Signed)
[name] requested rx. for Vit. D 5,000iu daily, for tax purposes.  Rx. printed, signed, mailed to pt/fim

## 2015-08-12 ENCOUNTER — Telehealth: Payer: Self-pay | Admitting: Neurology

## 2015-08-12 ENCOUNTER — Encounter: Payer: Self-pay | Admitting: *Deleted

## 2015-08-12 LAB — CBC WITH DIFFERENTIAL/PLATELET
Basophils Absolute: 0 10*3/uL (ref 0.0–0.2)
Basos: 0 %
EOS (ABSOLUTE): 0.1 10*3/uL (ref 0.0–0.4)
Eos: 1 %
Hematocrit: 44.3 % (ref 34.0–46.6)
Hemoglobin: 14.9 g/dL (ref 11.1–15.9)
Immature Grans (Abs): 0 10*3/uL (ref 0.0–0.1)
Immature Granulocytes: 0 %
Lymphocytes Absolute: 2.3 10*3/uL (ref 0.7–3.1)
Lymphs: 24 %
MCH: 30.2 pg (ref 26.6–33.0)
MCHC: 33.6 g/dL (ref 31.5–35.7)
MCV: 90 fL (ref 79–97)
Monocytes Absolute: 0.7 10*3/uL (ref 0.1–0.9)
Monocytes: 7 %
Neutrophils Absolute: 6.7 10*3/uL (ref 1.4–7.0)
Neutrophils: 68 %
Platelets: 317 10*3/uL (ref 150–379)
RBC: 4.93 x10E6/uL (ref 3.77–5.28)
RDW: 13.5 % (ref 12.3–15.4)
WBC: 9.9 10*3/uL (ref 3.4–10.8)

## 2015-08-12 MED ORDER — ALPHA LIPOIC ACID 200 MG PO CAPS: ORAL_CAPSULE | ORAL | Status: AC

## 2015-08-12 MED ORDER — BIOTIN 300 MCG PO TABS: 300.0000 ug | ORAL_TABLET | Freq: Three times a day (TID) | ORAL | Status: DC

## 2015-08-12 NOTE — Telephone Encounter (Signed)
-----   Message from [name], MD sent at 08/11/2015  5:39 PM EDT ----- Please see if we can get her last MRI of the brain and the last MRI of the cervical spine on CD performed at [facility]  (2015)

## 2015-08-12 NOTE — Telephone Encounter (Signed)
Patient is calling to get a letter stating she has MS and is permanently disabled. She needs the letter [place]. Please mail to the patient. Thank you.

## 2015-08-12 NOTE — Telephone Encounter (Signed)
I have spoken with Marcelino Duster at [facility]  and requested pt's mri brain on cd be mailed to [facility] to Dr. Bonnita Hollow attn/fim

## 2015-08-12 NOTE — Telephone Encounter (Signed)
I have spoken with Katherine Cameron.  1--Letter has been written and is ready for RAS to sign on Monday.  I will mail it to her once he has signed it.                 2-- She also requests printed rx's of Biotin and Alpha Lipoic Acid for tax purposes.  These have also been printed and are ready for RAS to sign.  3--She sts. she has reconsidered an antidepressant and would like to try one.  She has tried Prozac in the past and sts. she did not do well on it--would like to try a different one.  Will discuss with RAS on Monday and call her back/fim

## 2015-08-15 MED ORDER — ESCITALOPRAM OXALATE 10 MG PO TABS: 10.0000 mg | ORAL_TABLET | Freq: Every day | ORAL | Status: DC

## 2015-08-15 NOTE — Addendum Note (Signed)
Addended by: Candis Schatz I on: 08/15/2015 02:18 PM   Modules accepted: Orders

## 2015-08-15 NOTE — Telephone Encounter (Signed)
I have spoken with Katherine Cameron this afternoon and advised that rx's for Biotin and Alpha Lipoic acid, along with letter certifying disability, have been mailed to her home address.  Per RAS, ok to offer Escitalopram  po daily for depression.  She is agreeable with trying this.  I have escribed rx. to [facility] per her request/fim

## 2015-08-16 ENCOUNTER — Other Ambulatory Visit: Payer: Self-pay | Admitting: Neurology

## 2015-08-16 NOTE — Telephone Encounter (Signed)
Last OV note says she takes  at night.  Pharmacy says they have been filling 25-50mg  at night.  Forwarding to provider for review.

## 2015-08-18 ENCOUNTER — Telehealth: Payer: Self-pay | Admitting: *Deleted

## 2015-08-18 NOTE — Telephone Encounter (Signed)
-----   Message from [name], MD sent at 08/18/2015 10:19 AM EDT ----- Please let Katherine Cameron know that there were no changes between the old MRIs from [facility] and the more recent ones

## 2015-08-18 NOTE — Telephone Encounter (Signed)
I have spoken with Katherine Cameron this morning and per RAS, advised that he  has compared old and recent mri's and there were no changes.  She verbalized understanding of same/fim

## 2015-08-23 ENCOUNTER — Other Ambulatory Visit: Payer: Self-pay | Admitting: Neurology

## 2015-08-24 ENCOUNTER — Other Ambulatory Visit: Payer: Self-pay | Admitting: *Deleted

## 2015-08-24 MED ORDER — TECFIDERA 240 MG PO CPDR: 1.0000 | DELAYED_RELEASE_CAPSULE | Freq: Two times a day (BID) | ORAL | Status: DC

## 2015-08-24 NOTE — Telephone Encounter (Signed)
Tecfidera rx. faxed to [facility] fax # [phone]./fim

## 2015-08-30 ENCOUNTER — Telehealth: Payer: Self-pay | Admitting: Neurology

## 2015-08-30 NOTE — Telephone Encounter (Signed)
I have spoken with [facility] and verified that pt. is currently using Tecfidera, does not need starter pack/fim

## 2015-08-30 NOTE — Telephone Encounter (Signed)
Katherine Cameron with [facility] called stating her records show last time pt got Tecfider was 2014. Inquiring if pt should be started on starter pack or if she has current history of use. Please call and advise. She can be reached at [phone].

## 2015-09-02 ENCOUNTER — Ambulatory Visit: Payer: BLUE CROSS/BLUE SHIELD | Admitting: Neurology

## 2015-09-06 ENCOUNTER — Encounter: Payer: Self-pay | Admitting: Neurology

## 2015-11-09 ENCOUNTER — Ambulatory Visit: Payer: Managed Care, Other (non HMO) | Admitting: Neurology

## 2015-11-14 ENCOUNTER — Telehealth: Payer: Self-pay | Admitting: Neurology

## 2015-11-14 NOTE — Telephone Encounter (Signed)
I have spoken with Katherine Cameron and per RAS, advised it is not likely that Tecfidera is causing rash.  She should f/u with her pcp.  Hagan verbalized understanding of same/fim

## 2015-11-14 NOTE — Telephone Encounter (Signed)
I have spoken with Katherine Cameron--she c/o acne like rash on the back of her neck only, onset several days ago.  She wonders if rash might be due to Tecfidera.  She has been on Tecfidera for 3 yrs.  Will check with RAS and call her back today/fim

## 2015-11-14 NOTE — Telephone Encounter (Signed)
Pt called and states she has a rash on the back of her neck and believes it is due to her medication. She has not started any new medications or increase in dosage. Pt would like a call back [phone]

## 2015-11-17 NOTE — Telephone Encounter (Signed)
FYI-Patient is calling to advise that she went to Urgent Care yesterday and was diagnosed with Shingles.

## 2015-11-17 NOTE — Telephone Encounter (Signed)
I have spoken with Shalah, advised RAS is aware of shingles.  I have v/o from RAS for Valtrex, but Zennie sts. urgent care md has already given her Valtrex, as well as Keflex.  She will call back if she needs anything else/fim

## 2015-11-18 ENCOUNTER — Other Ambulatory Visit: Payer: Self-pay | Admitting: Neurology

## 2015-11-18 MED ORDER — PREDNISONE 10 MG PO TABS: ORAL_TABLET | ORAL | Status: DC

## 2015-11-18 NOTE — Telephone Encounter (Signed)
Patient is calling back. She states her foot drop has gotten worse because of the shingles and her eyes still hurt. She wants to know if she should get a Rx for steroids since the weekend is approaching. The patient uses [facility] on [address] in [place]. Please call and advise. Thank you.

## 2015-11-18 NOTE — Telephone Encounter (Signed)
I have spoken with [facility] and advised that RAS has sent in a 12 day dose pk; to [facility] on [address].  She verbalized understanding of same; will call next week if no improvement./fim

## 2015-11-21 ENCOUNTER — Telehealth: Payer: Self-pay | Admitting: Neurology

## 2015-11-21 NOTE — Telephone Encounter (Signed)
I have spoken with Katherine Cameron--she does have an electric scooter, but she bought this out of pocket--sts. ins. did not pay for any of this.  Will check with [facility] today to see if 08-11-15 ov note is sufficient for electric w/c/fim

## 2015-11-21 NOTE — Telephone Encounter (Signed)
Pt called requesting a RX for motorized chair for indoors.

## 2015-11-21 NOTE — Telephone Encounter (Signed)
I have faxed an order for power w/c eval, along with ins., demo sheets, and last ov note, to [facility], to the attn. of the Rehab dept./fim

## 2015-11-21 NOTE — Telephone Encounter (Signed)
I have spoken with [facility] and advised that I have faxed order for power w/c eval to [facility], with request that it be done asap if possible/fim

## 2015-11-22 ENCOUNTER — Telehealth: Payer: Self-pay | Admitting: Neurology

## 2015-11-22 NOTE — Telephone Encounter (Signed)
Pt called and says she is having a lot of diarrhea and wants to know if she could stop taking some or one of medication. She says she is feeling dehydrated. She is a little concerned. Please call and advise [phone]

## 2015-11-22 NOTE — Telephone Encounter (Signed)
She has been on an antibiotic for a bacterial infection and she has not been feeling well, in general. I told her antibiotic therapy can often cause diarrhea and to stay well hydrated and drink plenty of water/gatorade.  Dr. Epimenio Foot said if it continues after she is better, she could try holding her Ritalin for a couple of days but it is doubtful to be the cause of her symptoms. She verbalized understanding.

## 2015-12-15 ENCOUNTER — Ambulatory Visit (INDEPENDENT_AMBULATORY_CARE_PROVIDER_SITE_OTHER): Payer: Managed Care, Other (non HMO) | Admitting: Neurology

## 2015-12-15 ENCOUNTER — Encounter: Payer: Self-pay | Admitting: Neurology

## 2015-12-15 VITALS — BP 108/68 | HR 96 | Resp 12 | Ht 65.0 in | Wt 118.0 lb

## 2015-12-15 DIAGNOSIS — R5383 Other fatigue: Secondary | ICD-10-CM | POA: Diagnosis not present

## 2015-12-15 DIAGNOSIS — F329 Major depressive disorder, single episode, unspecified: Secondary | ICD-10-CM | POA: Diagnosis not present

## 2015-12-15 DIAGNOSIS — G35 Multiple sclerosis: Secondary | ICD-10-CM | POA: Diagnosis not present

## 2015-12-15 DIAGNOSIS — R269 Unspecified abnormalities of gait and mobility: Secondary | ICD-10-CM

## 2015-12-15 DIAGNOSIS — F32A Depression, unspecified: Secondary | ICD-10-CM

## 2015-12-15 MED ORDER — IMIPRAMINE HCL 25 MG PO TABS: 25.0000 mg | ORAL_TABLET | Freq: Every day | ORAL | Status: DC

## 2015-12-15 MED ORDER — DIAZEPAM 5 MG PO TABS: 5.0000 mg | ORAL_TABLET | Freq: Every evening | ORAL | Status: DC | PRN

## 2015-12-15 NOTE — Progress Notes (Signed)
[facility]  PATIENT: Katherine Cameron DOB: 07-20-1971  REFERRING CLINICIAN: Hedgecock, PA-C HISTORY FROM: Patient with input form husband  REASON FOR VISIT: MS and spastic gait   HISTORICAL  CHIEF COMPLAINT:  Chief Complaint  Patient presents with  . Multiple Sclerosis    Sts. she continues to tolerate Tecfidera well.  She recently had the shingles--sts. is completely recovered from this--her rash was on the back, right hand side of her neck.  She is here today for eval for a power wheelchair/fim    HISTORY OF PRESENT ILLNESS:  Katherine Cameron is a 45 year old woman who was diagnosed with MS at age 46.    She is having more difficulty with gait and is here today for a mobility evaluation.     She has right > left leg weakness and right arm weakness.  There is ome spasticity in her limbs, worse on the right.    She has fatigue.   She has MS demyelinating plaques in the cervical spine and brain.   Currently, she uses a scooter most of the time.    Her right leg is much weaker and she no longer uses the Rollator. She is only able to take a few steps with the Rollator.  She is unable to lift the right leg making the use of the walker very difficult for more than a step or 2.   She is on Ampyra and does worse if she misses a dose.    Due to falling at work she has been working from home more recently.     She has a lot of mobility related issues due to the weakness in the right > left side of her body, leg more than arm. Because of her mobility issues she mostly uses a scooter around the house. However, it is unable to help her for activities of daily living including moving around in some rooms, using the sink and having her hands free for other tasks.    She is unable to use a cane due to her severe difficulty with right leg strength, reduced balance and reduced arm strength. She is unable to use a walker for more than a couple of steps for the same reasons.  She is unable to  self propel in a manual wheelchair..  She currently uses a scooter.   However, a scooter is unable to allow her to do many of the necessary activities of daily living around the house including getting into some rooms of the house, using a sink, doing self care ADLs and cooking.   The scooter is not maneuverable around the house and the tiller gets in the way much of the time.   Therefore, she needs to use a power wheelchair.Marland Kitchen  A power wheelchair will allow her to do necessary activities of daily living around the house. It will be more maneuverable and allow her to get into every room.   Additionally, it will allow her hands to be free so she can do more in front of a sink or table.  Because she has weakness in the right arm, control should be on the left side. She has the cognitive abilities to operate a power wheelchair. She is motivated to use this type of vehicle.   MS History:  She presented with right optic neuritis. She went to an ophthalmologist and then was referred to neurology where she had an MRI of the brain performed. The MRI was consistent with MS. She did not  need to have a lumbar puncture. This was before FDA approved medications and she had several courses of IV steroids. Around 1994, she started Avonex after a large exacerbation around 2008, she went on Tysabri. She was more stable on Tysabri. Unfortunately, she had a a high titer JCV antibody. She was on Tysabri for about 3 years and then was switched to Tecfidera. She has been on Tecfidera for about 3 years. During the transition from Tysabri to [place], she had an especially severe exacerbation with significant worsening of strength in there left leg resulting in severe gait issues. A new focus was noted in the cervical spine at that time.   REVIEW OF SYSTEMS:  Constitutional: No fevers, chills, sweats, or change in appetite.  She has insomnia Eyes: No visual changes, double vision, eye pain Ear, nose and throat: No hearing  loss, ear pain, nasal congestion, sore throat Cardiovascular: No chest pain, palpitations Respiratory:  No shortness of breath at rest or with exertion.   No wheezes GastrointestinaI: No nausea, vomiting, diarrhea, abdominal pain, fecal incontinence Genitourinary:  No dysuria, urinary retention or frequency.  No nocturia. Musculoskeletal:  No neck pain, back pain.  She has left > right hip pain Integumentary: No rash, pruritus, skin lesions Neurological: as above Psychiatric: No depression at this time.  No anxiety Endocrine: No palpitations, diaphoresis, change in appetite, change in weigh or increased thirst Hematologic/Lymphatic:  No anemia, purpura, petechiae. Allergic/Immunologic: No itchy/runny eyes, nasal congestion, recent allergic reactions, rashes  ALLERGIES: No Known Allergies  HOME MEDICATIONS: Outpatient Prescriptions Prior to Visit  Medication Sig Dispense Refill  . Alpha Lipoic Acid 200 MG CAPS Take 2 tablets daily 60 capsule 11  . AMPYRA 10 MG TB12 Take 1 tablet (10 mg total) by mouth 2 (two) times daily. 180 tablet 2  . Biotin 300 MCG TABS Take 1 tablet (300 mcg total) by mouth 3 (three) times daily. 90 tablet 11  . Cholecalciferol (VITAMIN D3) 5000 UNITS CAPS Take 1 capsule (5,000 Units total) by mouth daily. 90 capsule 3  . co-enzyme Q-10 50 MG capsule Take 50 mg by mouth daily.    . Docosahexaenoic Acid (DHA PO) Take by mouth.    . escitalopram (LEXAPRO) 10 MG tablet Take 1 tablet (10 mg total) by mouth daily. 90 tablet 3  . EVENING PRIMROSE OIL PO Take by mouth.    Marland Kitchen ibuprofen (ADVIL,MOTRIN) 600 MG tablet Take 1 tablet (600 mg total) by mouth every 6 (six) hours as needed. 30 tablet 0  . methylphenidate (RITALIN) 20 MG tablet Take 1 tablet (20 mg total) by mouth 2 (two) times daily with breakfast and lunch. 60 tablet 0  . predniSONE (DELTASONE) 10 MG tablet 6 po qd x 2d then 5 poqd x 2d then 4 po qd x 2d then 3 po qd x 2d then 2 po qd x 2d then 1 po qd x 2d 42 tablet  0  . tamsulosin (FLOMAX) 0.4 MG CAPS capsule TAKE 1 CAPSULE BY MOUTH DAILY 90 capsule 2  . TECFIDERA 240 MG CPDR Take 1 capsule (240 mg total) by mouth 2 (two) times daily. 180 capsule 3  . imipramine (TOFRANIL) 25 MG tablet Take 1-2 tablets (25-50 mg total) by mouth at bedtime. 180 tablet 1   No facility-administered medications prior to visit.  also on Tecfidera 240 mg bid, imipramine 25 mg at night and Ritalin 20 mg once or twice a day  PAST MEDICAL HISTORY: Past Medical History  Diagnosis Date  .  Multiple sclerosis (HCC)   . Movement disorder   . Neuropathy (HCC)   . Vision abnormalities     PAST SURGICAL HISTORY: Past Surgical History  Procedure Laterality Date  . Pleural scarification      FAMILY HISTORY: Family History  Problem Relation Age of Onset  . Hypertension Mother   . Hyperlipidemia Mother   . Colon cancer Mother   . COPD Father   . Multiple sclerosis Sister     SOCIAL HISTORY:  Social History   Social History  . Marital Status: Married    Spouse Name: N/A  . Number of Children: N/A  . Years of Education: N/A   Occupational History  . Not on file.   Social History Main Topics  . Smoking status: Former Smoker    Types: Cigarettes  . Smokeless tobacco: Former Neurosurgeon    Quit date: 12/03/2014  . Alcohol Use: No  . Drug Use: No  . Sexual Activity: Yes    Birth Control/ Protection: Pill   Other Topics Concern  . Not on file   Social History Narrative     PHYSICAL EXAM  Filed Vitals:   12/15/15 1602  BP: 108/68  Pulse: 96  Resp: 12  Height:  (1.651 m)  Weight: 118 lb (53.524 kg)    Body mass index is 19.64 kg/(m^2).   General: The patient is well-developed and well-nourished and in no acute distress   Neurologic Exam  Mental status: The patient is alert and oriented x 3 at the time of the examination. The patient has apparent normal recent and remote memory, with an apparently normal attention span and concentration ability.    Speech is normal.  Cranial nerves: Extraocular movements are full.   Facial symmetry is present. There is good facial sensation to soft touch bilaterally.Facial strength is normal.  Trapezius and sternocleidomastoid strength is normal. No dysarthria is noted.  The tongue is midline, and the patient has symmetric elevation of the soft palate. No obvious hearing deficits are noted.  Motor:  Muscle bulk is normal and tone is increased in both legs right > left.   There is mild right arm increased tone. Strength is  5 / 5 in left arm, 4+/5 in right arm and reduced in the right leg (2/5) and mildly reduced in left leg (4+/5).   Sensory: Sensory testing is intact to touch and vibration sensation in all 4 extremities.  Coordination: Cerebellar testing reveals good finger-nose-finger but mildly reduced rapid alternating movements in right hand.   Heel-to-shin is poor on the left and she cannot do on the right.  Gait and station: Station is unstable and requires support.    She can only take two steps with bilateral support and is unable to lift right leg.  Marland Kitchen     Reflexes: Deep tendon reflexes are increased, right > left.     DIAGNOSTIC DATA (LABS, IMAGING, TESTING) - I reviewed patient records, labs, notes, testing and imaging myself where available.  Lab Results  Component Value Date   WBC 9.9 08/11/2015   HGB 14.9 12/16/2014   HCT 44.3 08/11/2015   MCV 90 08/11/2015   PLT 317 08/11/2015       ASSESSMENT AND PLAN  Multiple sclerosis (HCC)  Abnormal gait  Clinical depression  Disturbed cognition  Other fatigue   1.   Prescription for power wheelchair 2.   PT evaluation for wheelchair.   Try to stay active. 3.   Continue current med's.  4.   RTC 4 months, sooner if problems   We had a long 45 minute office visit face-to-face with greater than 50% of the time counseling and coordinating care about her MS and related mobility issues.Pearletha Furl. Epimenio Foot, MD, PhD  12/15/2015, 4:53 PM Certified in Neurology, Clinical Neurophysiology, Sleep Medicine, Pain Medicine and Neuroimaging  [facility] [address] [place], [place] [address] [phone]

## 2015-12-19 ENCOUNTER — Telehealth: Payer: Self-pay | Admitting: Neurology

## 2015-12-19 MED ORDER — BACLOFEN 10 MG PO TABS: 10.0000 mg | ORAL_TABLET | Freq: Three times a day (TID) | ORAL | Status: DC | PRN

## 2015-12-19 MED ORDER — TIZANIDINE HCL 4 MG PO TABS: 4.0000 mg | ORAL_TABLET | Freq: Three times a day (TID) | ORAL | Status: DC | PRN

## 2015-12-19 NOTE — Telephone Encounter (Signed)
I have spoken with Holy.  She sts. Valium leaves her to groggy the next day.  She asked if she can take Tizanidine at hs and Baclofen in the am.  Per RAS, ok for Tizanidine  po tid prn and Baclofen  po tid prn.  Dosing discussed with Angeliz and she is agreeable--will start with Tizanidine  one at hs and Baclofen  po one qam, and increase further if needed.  Rx's escribed to [facility] at [place] per Kenia's request./fim

## 2015-12-19 NOTE — Telephone Encounter (Signed)
Pt called inquiring if she could RX for muscle relaxer for daytime and nighttime. She said the valium wasn't helping, it only helped for 1-2 hrs and she felt groggy the next day.

## 2016-01-02 ENCOUNTER — Ambulatory Visit: Payer: Managed Care, Other (non HMO) | Admitting: Physical Therapy

## 2016-01-18 ENCOUNTER — Other Ambulatory Visit: Payer: Self-pay | Admitting: Neurology

## 2016-01-26 ENCOUNTER — Ambulatory Visit: Payer: Managed Care, Other (non HMO) | Attending: Neurology | Admitting: Physical Therapy

## 2016-01-26 DIAGNOSIS — M6281 Muscle weakness (generalized): Secondary | ICD-10-CM | POA: Diagnosis not present

## 2016-01-26 DIAGNOSIS — R262 Difficulty in walking, not elsewhere classified: Secondary | ICD-10-CM | POA: Diagnosis present

## 2016-01-27 NOTE — Therapy (Signed)
[facility] [address] [place], [place], [address] Phone: [phone]   Fax:  [phone]  Physical Therapy Evaluation  Patient Details  Name: Katherine Cameron MRN: [id number] Date of Birth: Jan 17, 1971 No Data Recorded  Encounter Date: 01/26/2016      PT End of Session - 01/27/16 1725    Visit Number 1   Number of Visits 1      Past Medical History  Diagnosis Date  . Multiple sclerosis (HCC)   . Movement disorder   . Neuropathy (HCC)   . Vision abnormalities     Past Surgical History  Procedure Laterality Date  . Pleural scarification      There were no vitals filed for this visit.  Visit Diagnosis:  Generalized muscle weakness - Plan: PT plan of care cert/re-cert  Difficulty walking - Plan: PT plan of care cert/re-cert    LMN for power wheelchair to be completed - Josh Cadle, ATP from [facility] present during eval - he recommends Quantum Q6 Edge with power tilt  For pt to be independent and effective with pressure relief    W/chair eval to be added in addendum upon completion                                  Problem List Patient Active Problem List   Diagnosis Date Noted  . Disturbed cognition 04/29/2015  . Other fatigue 04/29/2015  . Fatigue 04/29/2015  . Cognitive decline 04/29/2015  . Clinical depression 12/16/2014  . Disease of urinary tract 12/16/2014  . DDD (degenerative disc disease), lumbosacral 08/28/2014  . Dropfoot 08/28/2014  . Cannot sleep 08/28/2014  . Lumbosacral spondylosis 08/28/2014  . Multiple sclerosis (HCC) 08/28/2014  . Loss of feeling or sensation 08/28/2014  . 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 Pain in thoracic spine 08/28/2014  . Degeneration of intervertebral disc of lumbosacral region 08/28/2014  . 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 Abnormal gait 07/07/2014  . Dysthymia 07/07/2014    Kary Kos, PT 01/28/2016, 12:38  PM  [facility] [address] [place], [place], [address] Phone: [phone]   Fax:  [phone]  Name: Katherine Cameron MRN: [id number] Date of Birth: 1971/10/24

## 2016-04-10 ENCOUNTER — Encounter: Payer: Self-pay | Admitting: Neurology

## 2016-04-10 ENCOUNTER — Ambulatory Visit (INDEPENDENT_AMBULATORY_CARE_PROVIDER_SITE_OTHER): Payer: Managed Care, Other (non HMO) | Admitting: Neurology

## 2016-04-10 DIAGNOSIS — R269 Unspecified abnormalities of gait and mobility: Secondary | ICD-10-CM | POA: Diagnosis not present

## 2016-04-10 DIAGNOSIS — R5383 Other fatigue: Secondary | ICD-10-CM

## 2016-04-10 DIAGNOSIS — Z79899 Other long term (current) drug therapy: Secondary | ICD-10-CM | POA: Diagnosis not present

## 2016-04-10 DIAGNOSIS — G35 Multiple sclerosis: Secondary | ICD-10-CM

## 2016-04-10 DIAGNOSIS — R4189 Other symptoms and signs involving cognitive functions and awareness: Secondary | ICD-10-CM | POA: Diagnosis not present

## 2016-04-10 DIAGNOSIS — F329 Major depressive disorder, single episode, unspecified: Secondary | ICD-10-CM | POA: Diagnosis not present

## 2016-04-10 DIAGNOSIS — F32A Depression, unspecified: Secondary | ICD-10-CM

## 2016-04-10 MED ORDER — METHYLPHENIDATE HCL 20 MG PO TABS: 20.0000 mg | ORAL_TABLET | Freq: Two times a day (BID) | ORAL | Status: DC

## 2016-04-10 NOTE — Progress Notes (Signed)
[facility]  PATIENT: Katherine Cameron DOB: 11-16-1971  REFERRING CLINICIAN: Hedgecock, PA-C HISTORY FROM: Patient with input form husband  REASON FOR VISIT: MS and spastic gait   HISTORICAL  CHIEF COMPLAINT:  Chief Complaint  Patient presents with  . Multiple Sclerosis    Sts. she continues to tolerate Tecfidera well; just doesn't think it does much for her.  She would like to discuss Ocrelizumab.  Would also like to discuss wounds that don't seem to heal as fast, and splotchy rash on chest that has been preent for over one yr./fim    HISTORY OF PRESENT ILLNESS:  Katherine Cameron is a 45 year old woman with MS since age 43.    She is having more difficulty with gait and now needs scooter almost continually.   She started Biotin 300 mg and tolerates it well.      MRI of the brain and spinal cord were performed last August.   There was no definite change compared to last year.       We will try to get her last films from [facility] for comparison.  Gait/strength/sensation:   Her gait fluctuates quite a bit. She is having a good day today and can walk around the house just using furniture to hold on. Other days, she needs to use the walker. She uses a scooter outside..   She is on Ampyra and does worse if she misses a dose.    Due to falling at work she has been working from home more recently.   The dysesthesias are better since starting imipramine.      Bladder function:   She has a urinary hesitancy but also has urinary frequency and urgency.  She has incontinence out of the house but not in the house.   She wears Depends out of the house. She has not seen urology.   Imipramine and Tamsulosin help some.    She also has had bowel incontinence.   We also talked about botox.    She gets anxious when she is out of the house because of the bladder function.     Vision:    She has decreased vision out of the left from old ON but it is stable  Fatigue/sleep:   Fatigue bothers her  most days.      She takes Ritalin 20 mg one to 3 a day most days but occasioanlly skips if nothing planned.      Mood/cognition:  Mood is doing better the last few months and she continues on Lexapro.    She has noted some cognitive decline.   She has reduced focus, STM and she has trouble with verbal fluency.  She notes difficulty doing some of her tasks for work.   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     MS History:  She presented with right optic neuritis. She went to an ophthalmologist and then was referred to neurology where she had an MRI of the brain performed. The MRI was consistent with MS. She did not need to have a lumbar puncture. This was before FDA approved medications and she had several courses of IV steroids. Around 1994, she started Avonex after a large exacerbation around 2008, she went on Tysabri. She was more stable on Tysabri. Unfortunately, she had a a high titer JCV antibody. She was on Tysabri for about 3 years and then  was switched to Tecfidera. She has been on Tecfidera for about 3 years. During the transition from Tysabri to [place], she had an especially severe exacerbation with significant worsening of her gait. A new focus was noted in the cervical spine.      REVIEW OF SYSTEMS:  Constitutional: No fevers, chills, sweats, or change in appetite.  She has insomnia Eyes: No visual changes, double vision, eye pain Ear, nose and throat: No hearing loss, ear pain, nasal congestion, sore throat Cardiovascular: No chest pain, palpitations Respiratory:  No shortness of breath at rest or with exertion.   No wheezes GastrointestinaI: No nausea, vomiting, diarrhea, abdominal pain, fecal incontinence Genitourinary:  No dysuria, urinary retention or frequency.  No nocturia. Musculoskeletal:  No neck pain, back pain.  She has left >  right hip pain Integumentary: No rash, pruritus, skin lesions Neurological: as above Psychiatric: No depression at this time.  No anxiety Endocrine: No palpitations, diaphoresis, change in appetite, change in weigh or increased thirst Hematologic/Lymphatic:  No anemia, purpura, petechiae. Allergic/Immunologic: No itchy/runny eyes, nasal congestion, recent allergic reactions, rashes  ALLERGIES: No Known Allergies  HOME MEDICATIONS: Outpatient Prescriptions Prior to Visit  Medication Sig Dispense Refill  . Alpha Lipoic Acid 200 MG CAPS Take 2 tablets daily 60 capsule 11  . AMPYRA 10 MG TB12 TAKE 1 TABLET BY MOUTH TWICE DAILY 180 tablet 1  . Biotin 300 MCG TABS Take 1 tablet (300 mcg total) by mouth 3 (three) times daily. 90 tablet 11  . Cholecalciferol (VITAMIN D3) 5000 UNITS CAPS Take 1 capsule (5,000 Units total) by mouth daily. 90 capsule 3  . co-enzyme Q-10 50 MG capsule Take 50 mg by mouth daily.    . Docosahexaenoic Acid (DHA PO) Take by mouth.    . escitalopram (LEXAPRO) 10 MG tablet Take 1 tablet (10 mg total) by mouth daily. 90 tablet 3  . EVENING PRIMROSE OIL PO Take by mouth.    Marland Kitchen ibuprofen (ADVIL,MOTRIN) 600 MG tablet Take 1 tablet (600 mg total) by mouth every 6 (six) hours as needed. 30 tablet 0  . imipramine (TOFRANIL) 25 MG tablet Take 1-2 tablets (25-50 mg total) by mouth at bedtime. 180 tablet 1  . methylphenidate (RITALIN) 20 MG tablet Take 1 tablet (20 mg total) by mouth 2 (two) times daily with breakfast and lunch. 60 tablet 0  . tamsulosin (FLOMAX) 0.4 MG CAPS capsule TAKE 1 CAPSULE BY MOUTH DAILY 90 capsule 2  . TECFIDERA 240 MG CPDR Take 1 capsule (240 mg total) by mouth 2 (two) times daily. 180 capsule 3  . tiZANidine (ZANAFLEX) 4 MG tablet Take 1 tablet (4 mg total) by mouth 3 (three) times daily as needed for muscle spasms. 90 tablet 3  . baclofen (LIORESAL) 10 MG tablet Take 1 tablet (10 mg total) by mouth 3 (three) times daily as needed for muscle spasms.  (Patient not taking: Reported on 04/10/2016) 90 each 3  . diazepam (VALIUM) 5 MG tablet Take 1 tablet (5 mg total) by mouth at bedtime as needed for anxiety. (Patient not taking: Reported on 04/10/2016) 30 tablet 5  . predniSONE (DELTASONE) 10 MG tablet 6 po qd x 2d then 5 poqd x 2d then 4 po qd x 2d then 3 po qd x 2d then 2 po qd x 2d then 1 po qd x 2d (Patient not taking: Reported on 04/10/2016) 42 tablet 0   No facility-administered medications prior to visit.  also on Tecfidera 240 mg bid, imipramine 25 mg  at night and Ritalin 20 mg once or twice a day  PAST MEDICAL HISTORY: Past Medical History  Diagnosis Date  . Multiple sclerosis (HCC)   . Movement disorder   . Neuropathy (HCC)   . Vision abnormalities     PAST SURGICAL HISTORY: Past Surgical History  Procedure Laterality Date  . Pleural scarification      FAMILY HISTORY: Family History  Problem Relation Age of Onset  . Hypertension Mother   . Hyperlipidemia Mother   . Colon cancer Mother   . COPD Father   . Multiple sclerosis Sister     SOCIAL HISTORY:  Social History   Social History  . Marital Status: Married    Spouse Name: N/A  . Number of Children: N/A  . Years of Education: N/A   Occupational History  . Not on file.   Social History Main Topics  . Smoking status: Former Smoker    Types: Cigarettes  . Smokeless tobacco: Former Neurosurgeon    Quit date: 12/03/2014  . Alcohol Use: No  . Drug Use: No  . Sexual Activity: Yes    Birth Control/ Protection: Pill   Other Topics Concern  . Not on file   Social History Narrative     PHYSICAL EXAM  There were no vitals filed for this visit.  There is no weight on file to calculate BMI.   General: The patient is well-developed and well-nourished and in no acute distress   Neurologic Exam  Mental status: The patient is alert and oriented x 3 at the time of the examination. The patient has apparent normal recent and remote memory, with an apparently normal  attention span and concentration ability.   Speech is normal.  Cranial nerves: Extraocular movements are full.   Facial symmetry is present. There is good facial sensation to soft touch bilaterally.Facial strength is normal.  Trapezius and sternocleidomastoid strength is normal. No dysarthria is noted.  The tongue is midline, and the patient has symmetric elevation of the soft palate. No obvious hearing deficits are noted.  Motor:  Muscle bulk is normal and tone is increased in both legs right > left.   There is minimal right arm increased tone. Strength is  5 / 5 in arms and minimally reduced in right leg (4+/5).   Sensory: Sensory testing is intact to touch and vibration sensation in all 4 extremities.  Coordination: Cerebellar testing reveals good finger-nose-finger but mildly reduced rapid alternating movements in right hand.   Heel-to-shin is reduced bilaterally, worse on right.  Gait and station: Station is unstable.    Gait is spastic and requires support.     Reflexes: Deep tendon reflexes are increased, right > left.     DIAGNOSTIC DATA (LABS, IMAGING, TESTING) - I reviewed patient records, labs, notes, testing and imaging myself where available.  Lab Results  Component Value Date   WBC 9.9 08/11/2015   HGB 14.9 12/16/2014   HCT 44.3 08/11/2015   MCV 90 08/11/2015   PLT 317 08/11/2015       ASSESSMENT AND PLAN  Multiple sclerosis (HCC) - Plan: CBC with Differential/Platelet, Hepatitis B surface antibody, Hepatitis B surface antigen, Hepatitis B core antibody, total, Hepatitis C antibody, Quantiferon tb gold assay (blood), Hepatic function panel  High risk medication use - Plan: CBC with Differential/Platelet, Hepatitis B surface antibody, Hepatitis B surface antigen, Hepatitis B core antibody, total, Hepatitis C antibody, Quantiferon tb gold assay (blood), Hepatic function panel  Abnormal gait  Clinical depression  Cognitive decline  Other fatigue     1.    Continue Tecfidera for now.   Check CBC with differential to assess for lymphopenia. If present, consider switching medications sooner. We also discussed ocrelizumab in detail. She expressed interest in the past and is still potentially interested in switching to ocrelizumab as it appears to be a more efficacious medication.   We will check labs to rule out chronic infections and she will give this more thought. 2.  Continue  Ritalin .  New script.    3.   She will return to see me in about 4 months or sooner if she has any new or worsening neurologic dysfunction.  We will check an MRI about the time of the next visit and I would recommend a switch to a more efficacious medication if we see changes compared to last year.   We had a long 45 minute office visit face-to-face with greater than 50% of the time counseling and coordinating care about her MS.     Koichi Platte A. Epimenio Foot, MD, PhD 04/10/2016, 4:59 PM Certified in Neurology, Clinical Neurophysiology, Sleep Medicine, Pain Medicine and Neuroimaging  [facility] [address], [address] [place], [place] [address] [phone]

## 2016-04-11 LAB — CBC WITH DIFFERENTIAL/PLATELET
Basophils Absolute: 0 10*3/uL (ref 0.0–0.2)
Basos: 0 %
EOS (ABSOLUTE): 0.1 10*3/uL (ref 0.0–0.4)
Eos: 1 %
Hematocrit: 43.7 % (ref 34.0–46.6)
Hemoglobin: 14.5 g/dL (ref 11.1–15.9)
Immature Grans (Abs): 0 10*3/uL (ref 0.0–0.1)
Immature Granulocytes: 0 %
Lymphocytes Absolute: 2.1 10*3/uL (ref 0.7–3.1)
Lymphs: 19 %
MCH: 30 pg (ref 26.6–33.0)
MCHC: 33.2 g/dL (ref 31.5–35.7)
MCV: 91 fL (ref 79–97)
Monocytes Absolute: 0.9 10*3/uL (ref 0.1–0.9)
Monocytes: 8 %
Neutrophils Absolute: 8.1 10*3/uL — ABNORMAL HIGH (ref 1.4–7.0)
Neutrophils: 72 %
Platelets: 337 10*3/uL (ref 150–379)
RBC: 4.83 x10E6/uL (ref 3.77–5.28)
RDW: 13.4 % (ref 12.3–15.4)
WBC: 11.2 10*3/uL — ABNORMAL HIGH (ref 3.4–10.8)

## 2016-04-11 LAB — HEPATIC FUNCTION PANEL
ALT: 20 IU/L (ref 0–32)
AST: 17 IU/L (ref 0–40)
Albumin: 4.8 g/dL (ref 3.5–5.5)
Alkaline Phosphatase: 36 IU/L — ABNORMAL LOW (ref 39–117)
Bilirubin Total: 0.3 mg/dL (ref 0.0–1.2)
Bilirubin, Direct: 0.13 mg/dL (ref 0.00–0.40)
Total Protein: 7.1 g/dL (ref 6.0–8.5)

## 2016-04-11 LAB — HEPATITIS B SURFACE ANTIGEN: Hepatitis B Surface Ag: NEGATIVE

## 2016-04-11 LAB — HEPATITIS B CORE ANTIBODY, TOTAL: Hep B Core Total Ab: NEGATIVE

## 2016-04-11 LAB — HEPATITIS B SURFACE ANTIBODY,QUALITATIVE: Hep B Surface Ab, Qual: NONREACTIVE

## 2016-04-11 LAB — HEPATITIS C ANTIBODY: Hep C Virus Ab: 0.1 s/co ratio (ref 0.0–0.9)

## 2016-04-13 LAB — QUANTIFERON IN TUBE
QFT TB AG MINUS NIL VALUE: <0 IU/mL
QUANTIFERON MITOGEN VALUE: 4.6 IU/mL
QUANTIFERON TB AG VALUE: 0.02 IU/mL
QUANTIFERON TB GOLD: NEGATIVE
Quantiferon Nil Value: 0.03 IU/mL

## 2016-04-13 LAB — QUANTIFERON TB GOLD ASSAY (BLOOD)

## 2016-04-16 ENCOUNTER — Telehealth: Payer: Self-pay | Admitting: *Deleted

## 2016-04-16 NOTE — Telephone Encounter (Signed)
-----   Message from [name], MD sent at 04/13/2016  1:51 PM EDT ----- Labs are good, we can send in the ocrelizumab form

## 2016-04-16 NOTE — Telephone Encounter (Signed)
I have spoken with Katherine Cameron and per RAS, advised labs are good.  She has not signed an Ocrelizumab srf, as she is not sure she wants to switch meds.  She verbalized understanding of same; will let us know if she would like to start Ocrelizumab at some point in the future/fim

## 2016-06-17 ENCOUNTER — Other Ambulatory Visit: Payer: Self-pay | Admitting: Neurology

## 2016-06-18 ENCOUNTER — Telehealth: Payer: Self-pay | Admitting: Neurology

## 2016-06-18 NOTE — Telephone Encounter (Signed)
Paperwork completed and faxed back to [facility].Katherine Cameron

## 2016-06-18 NOTE — Telephone Encounter (Signed)
[name] with [facility] is faxing over new paperwork for the wheel chair for this patient for Dr. Epimenio Foot to sign off on. Thanks!

## 2016-07-10 ENCOUNTER — Telehealth: Payer: Self-pay | Admitting: *Deleted

## 2016-07-10 MED ORDER — TECFIDERA 240 MG PO CPDR: 1.0000 | DELAYED_RELEASE_CAPSULE | Freq: Two times a day (BID) | ORAL | 3 refills | Status: DC

## 2016-07-10 NOTE — Telephone Encounter (Signed)
Tecfidera escribed to [facility] per faxed request/fim 

## 2016-08-01 ENCOUNTER — Telehealth: Payer: Self-pay | Admitting: Neurology

## 2016-08-01 MED ORDER — METHYLPHENIDATE HCL 20 MG PO TABS: ORAL_TABLET | ORAL | 0 refills | Status: DC

## 2016-08-01 NOTE — Telephone Encounter (Signed)
Patient called to request refill of methylphenidate (RITALIN) 20 MG tablet and for prescription to be mailed to her. Patient advised this medication is a controlled substance and has to be picked up. Patient understands and agrees.

## 2016-08-01 NOTE — Telephone Encounter (Signed)
Rx. awaiting RAS sig/fim 

## 2016-08-02 ENCOUNTER — Other Ambulatory Visit: Payer: Self-pay | Admitting: *Deleted

## 2016-08-02 DIAGNOSIS — R269 Unspecified abnormalities of gait and mobility: Secondary | ICD-10-CM

## 2016-08-02 DIAGNOSIS — G35 Multiple sclerosis: Secondary | ICD-10-CM

## 2016-08-02 NOTE — Telephone Encounter (Signed)
Ritalin rx. up front [facility]/fim 

## 2016-08-10 ENCOUNTER — Ambulatory Visit: Payer: Managed Care, Other (non HMO) | Admitting: Neurology

## 2016-08-22 ENCOUNTER — Other Ambulatory Visit: Payer: Self-pay | Admitting: Neurology

## 2016-08-24 ENCOUNTER — Ambulatory Visit: Payer: Self-pay | Admitting: Neurology

## 2016-08-29 ENCOUNTER — Other Ambulatory Visit: Payer: Managed Care, Other (non HMO)

## 2016-09-05 ENCOUNTER — Telehealth: Payer: Self-pay | Admitting: Neurology

## 2016-09-05 NOTE — Telephone Encounter (Signed)
I have spoken with [facility] today.  She sts. due to physical and cognitive disabilities, she feels she is no longer able to work.  Sts. her employer offers short term disability.  She will bring paperwork with her to her next ov/fim

## 2016-09-05 NOTE — Telephone Encounter (Signed)
Patient called regarding questions on going out on disability, please call [phone].

## 2016-09-10 ENCOUNTER — Ambulatory Visit
Admission: RE | Admit: 2016-09-10 | Discharge: 2016-09-10 | Disposition: A | Discharge status: Home / Self Care | Payer: Managed Care, Other (non HMO) | Source: Ambulatory Visit | Attending: Neurology | Admitting: Neurology

## 2016-09-10 DIAGNOSIS — R269 Unspecified abnormalities of gait and mobility: Secondary | ICD-10-CM

## 2016-09-10 DIAGNOSIS — G35 Multiple sclerosis: Secondary | ICD-10-CM

## 2016-09-10 MED ORDER — GADOBENATE DIMEGLUMINE 529 MG/ML IV SOLN
11.0000 mL | Freq: Once | INTRAVENOUS | Status: AC | PRN
  Administered 2016-09-10: 11 mL via INTRAVENOUS

## 2016-09-13 ENCOUNTER — Telehealth: Payer: Self-pay | Admitting: Neurology

## 2016-09-13 NOTE — Telephone Encounter (Signed)
I spoke to [facility] about the MRIs of the brain  and cervical spine. No changes compared to the studies last year. We briefly discussed therapy versus switching to a different medication. Additionally, she is finding more difficulty working, even from home and we discussed disability. She will come in to see me in about couple weeks.

## 2016-09-24 ENCOUNTER — Other Ambulatory Visit: Payer: Self-pay | Admitting: Neurology

## 2016-09-25 ENCOUNTER — Other Ambulatory Visit: Payer: Self-pay | Admitting: Neurology

## 2016-09-28 ENCOUNTER — Ambulatory Visit: Payer: Managed Care, Other (non HMO) | Admitting: Neurology

## 2016-10-09 ENCOUNTER — Telehealth: Payer: Self-pay | Admitting: Neurology

## 2016-10-09 ENCOUNTER — Telehealth: Payer: Self-pay | Admitting: *Deleted

## 2016-10-09 MED ORDER — TIZANIDINE HCL 4 MG PO TABS: 4.0000 mg | ORAL_TABLET | Freq: Three times a day (TID) | ORAL | 3 refills | Status: DC | PRN

## 2016-10-09 NOTE — Telephone Encounter (Signed)
LMTC.  I am finished with her disability paperwork--just need to know if she wants [name] to pick it up, if she wants me to fax it to [facility], if she wants me to mail it to her, or if she wants to pick it up at her 10-23-16 appt. with RAS/fim

## 2016-10-09 NOTE — Telephone Encounter (Signed)
Pt returned RN's call. She advised to please fax to [facility]. Thank you

## 2016-10-09 NOTE — Telephone Encounter (Signed)
Patient requesting refill of tiZANidine (ZANAFLEX) 4 MG tablet Pharmacy: [id number] - [place], [place] - [address]

## 2016-10-09 NOTE — Telephone Encounter (Signed)
Rx. escribed to [facility] as requested/fim 

## 2016-10-09 NOTE — Telephone Encounter (Signed)
Disability paperwork faxed to [facility] fax# [phone].  Copy mailed to pt. for her records.  Copy sent to be scanned into EPIC/fim

## 2016-10-23 ENCOUNTER — Ambulatory Visit (INDEPENDENT_AMBULATORY_CARE_PROVIDER_SITE_OTHER): Payer: Managed Care, Other (non HMO) | Admitting: Neurology

## 2016-10-23 ENCOUNTER — Encounter: Payer: Self-pay | Admitting: Neurology

## 2016-10-23 VITALS — BP 116/78 | HR 84 | Resp 16 | Ht 65.0 in | Wt 120.0 lb

## 2016-10-23 DIAGNOSIS — R269 Unspecified abnormalities of gait and mobility: Secondary | ICD-10-CM

## 2016-10-23 DIAGNOSIS — R5383 Other fatigue: Secondary | ICD-10-CM

## 2016-10-23 DIAGNOSIS — G35 Multiple sclerosis: Secondary | ICD-10-CM | POA: Diagnosis not present

## 2016-10-23 DIAGNOSIS — R4189 Other symptoms and signs involving cognitive functions and awareness: Secondary | ICD-10-CM | POA: Diagnosis not present

## 2016-10-23 DIAGNOSIS — R39198 Other difficulties with micturition: Secondary | ICD-10-CM

## 2016-10-23 MED ORDER — DIAZEPAM 5 MG PO TABS: 5.0000 mg | ORAL_TABLET | Freq: Every evening | ORAL | 5 refills | Status: DC | PRN

## 2016-10-23 MED ORDER — MIRABEGRON ER 25 MG PO TB24: 25.0000 mg | ORAL_TABLET | Freq: Every day | ORAL | 5 refills | Status: DC

## 2016-10-23 MED ORDER — METHYLPHENIDATE HCL 20 MG PO TABS: ORAL_TABLET | ORAL | 0 refills | Status: DC

## 2016-10-23 NOTE — Progress Notes (Signed)
[facility]  PATIENT: Katherine Cameron DOB: July 21, 1971  REFERRING CLINICIAN: Hedgecock, PA-C HISTORY FROM: Patient with input form husband  REASON FOR VISIT: MS and spastic gait   HISTORICAL  CHIEF COMPLAINT:  Chief Complaint  Patient presents with  . Multiple Sclerosis    Sts. she continues to tolerate Tecfidera well.  Sts. she continues to have difficulty staying asleep--sts. she sleeps for about 3 hrs, then is awake/fim    HISTORY OF PRESENT ILLNESS:  Katherine Cameron is a 45 year old woman with MS since age 45.    MRI of the brain and cervical spine 09/10/2016 did not show any new lesions.  MRI images were reviewed with her and her husband  MS:    She is on Tecfidera and tolerates it well.    She also takes Biotin 300 mg and tolerates it well.     She has not noted any significant worsening since her last visit and she notes no exacerbation.  Gait/strength/sensation:   Her gait fluctuates quite a bit. At her best, she can walk around the house just using furniture to hold on. Other days, she needs to use the walker. She uses a scooter outside..   She is on Ampyra and does worse if she misses a dose.     The dysesthesias are better since starting imipramine.      Bladder function:   She has a urinary hesitancy more than urinary frequency and urgency.  She has incontinence out of the house but not in the house.   She wears Depends out of the house. She has not seen urology.   Imipramine and Tamsulosin help some.      Vision:    She has decreased vision out of the left from old ON but it is stable  Fatigue/sleep:   Fatigue bothers her most days.      She takes Ritalin 40 mg some days.  Although this helps the fatigue, it causes heart palpitations at times.   She reports that she can fall asleep easily but often wakes up about 4 hours later and it has trouble falling back asleep. If she does not take the imipramine, insomnia is much worse.   Mood/cognition:  Mood is doing  better with Lexapro. She denies any significant depression at this time.   She has noted some cognitive decline.   She has reduced focus, STM and she has trouble with verbal fluency.  She notes difficulty doing some cognitive tasks.   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     MS History:  She presented with right optic neuritis. She went to an ophthalmologist and then was referred to neurology where she had an MRI of the brain performed. The MRI was consistent with MS. She did not need to have a lumbar puncture. This was before FDA approved medications and she had several courses of IV steroids. Around 1994, she started Avonex after a large exacerbation around 2008, she went on Tysabri. She was more stable on Tysabri. Unfortunately, she had a a high titer JCV antibody. She was on Tysabri for about 3 years and then was switched to Tecfidera. She has been on Tecfidera for about 3 years. During the transition from Tysabri to [place], she had an especially severe exacerbation with significant worsening of her gait. A new focus was noted in the cervical spine.  REVIEW OF SYSTEMS:  Constitutional: No fevers, chills, sweats, or change in appetite.  She has insomnia Eyes: No visual changes, double vision, eye pain Ear, nose and throat: No hearing loss, ear pain, nasal congestion, sore throat Cardiovascular: No chest pain, palpitations Respiratory:  No shortness of breath at rest or with exertion.   No wheezes GastrointestinaI: No nausea, vomiting, diarrhea, abdominal pain, fecal incontinence Genitourinary:  No dysuria, urinary retention or frequency.  No nocturia. Musculoskeletal:  No neck pain, back pain.  She has left > right hip pain Integumentary: No rash, pruritus, skin lesions Neurological: as above Psychiatric: No depression at this time.  No  anxiety Endocrine: No palpitations, diaphoresis, change in appetite, change in weigh or increased thirst Hematologic/Lymphatic:  No anemia, purpura, petechiae. Allergic/Immunologic: No itchy/runny eyes, nasal congestion, recent allergic reactions, rashes  ALLERGIES: No Known Allergies  HOME MEDICATIONS: Outpatient Medications Prior to Visit  Medication Sig Dispense Refill  . Alpha Lipoic Acid 200 MG CAPS Take 2 tablets daily 60 capsule 11  . AMPYRA 10 MG TB12 TAKE 1 TABLET BY MOUTH TWICE DAILY 180 tablet 1  . baclofen (LIORESAL) 10 MG tablet Take 1 tablet (10 mg total) by mouth 3 (three) times daily as needed for muscle spasms. 90 each 3  . Biotin 300 MCG TABS Take 1 tablet (300 mcg total) by mouth 3 (three) times daily. 90 tablet 11  . Cholecalciferol (VITAMIN D3) 5000 UNITS CAPS Take 1 capsule (5,000 Units total) by mouth daily. 90 capsule 3  . co-enzyme Q-10 50 MG capsule Take 50 mg by mouth daily.    . Docosahexaenoic Acid (DHA PO) Take by mouth.    . escitalopram (LEXAPRO) 10 MG tablet TAKE 1 TABLET BY MOUTH DAILY 90 tablet 2  . EVENING PRIMROSE OIL PO Take by mouth.    Marland Kitchen ibuprofen (ADVIL,MOTRIN) 600 MG tablet Take 1 tablet (600 mg total) by mouth every 6 (six) hours as needed. 30 tablet 0  . imipramine (TOFRANIL) 25 MG tablet TAKE 1 TO 2 TABLETS(25 TO 50 MG) BY MOUTH AT BEDTIME 180 tablet 0  . tamsulosin (FLOMAX) 0.4 MG CAPS capsule TAKE 1 CAPSULE BY MOUTH DAILY 90 capsule 2  . TECFIDERA 240 MG CPDR Take 1 capsule (240 mg total) by mouth 2 (two) times daily. 180 capsule 3  . tiZANidine (ZANAFLEX) 4 MG tablet Take 1 tablet (4 mg total) by mouth 3 (three) times daily as needed for muscle spasms. 90 tablet 3  . diazepam (VALIUM) 5 MG tablet Take 1 tablet (5 mg total) by mouth at bedtime as needed for anxiety. 30 tablet 5  . methylphenidate (RITALIN) 20 MG tablet Take one tablet twice daily, with breakfast and lunch 60 tablet 0  . predniSONE (DELTASONE) 10 MG tablet 6 po qd x 2d then 5  poqd x 2d then 4 po qd x 2d then 3 po qd x 2d then 2 po qd x 2d then 1 po qd x 2d (Patient not taking: Reported on 04/10/2016) 42 tablet 0   No facility-administered medications prior to visit.   also on Tecfidera 240 mg bid, imipramine 25 mg at night and Ritalin 20 mg once or twice a day  PAST MEDICAL HISTORY: Past Medical History:  Diagnosis Date  . Movement disorder   . Multiple sclerosis (HCC)   . Neuropathy (HCC)   . Vision abnormalities     PAST SURGICAL HISTORY: Past Surgical History:  Procedure Laterality Date  . PLEURAL SCARIFICATION      FAMILY  HISTORY: Family History  Problem Relation Age of Onset  . Hypertension Mother   . Hyperlipidemia Mother   . Colon cancer Mother   . COPD Father   . Multiple sclerosis Sister     SOCIAL HISTORY:  Social History   Social History  . Marital status: Married    Spouse name: N/A  . Number of children: N/A  . Years of education: N/A   Occupational History  . Not on file.   Social History Main Topics  . Smoking status: Former Smoker    Types: Cigarettes  . Smokeless tobacco: Former Neurosurgeon    Quit date: 12/03/2014  . Alcohol use No  . Drug use: No  . Sexual activity: Yes    Birth control/ protection: Pill   Other Topics Concern  . Not on file   Social History Narrative  . No narrative on file     PHYSICAL EXAM  Vitals:   10/23/16 1545  BP: 116/78  Pulse: 84  Resp: 16  Weight: 120 lb (54.4 kg)  Height: 5\' 5"  (1.651 m)    Body mass index is 19.97 kg/m.   General: The patient is well-developed and well-nourished and in no acute distress   Neurologic Exam  Mental status: The patient is alert and oriented x 3 at the time of the examination. The patient has apparent normal recent and remote memory, with an apparently normal attention span and concentration ability.   Speech is normal.  Cranial nerves: Extraocular movements are full.   Facial symmetry is present. There is good facial sensation to soft touch  bilaterally.Facial strength is normal.  Trapezius and sternocleidomastoid strength is normal. No dysarthria is noted.  The tongue is midline, and the patient has symmetric elevation of the soft palate. No obvious hearing deficits are noted.  Motor:  Muscle bulk is normal and tone is increased in both legs right > left.   There is minimal right arm increased tone. Strength is  5 / 5 in arms and minimally reduced in right leg (4+/5).   Sensory: Sensory testing is intact to touch and vibration sensation in all 4 extremities.  Coordination: Cerebellar testing reveals good finger-nose-finger but mildly reduced rapid alternating movements in right hand.   Heel-to-shin is reduced bilaterally, worse on right.  Gait and station: Station is unstable.    Gait is spastic and requires support.     Reflexes: Deep tendon reflexes are increased, right > left.     DIAGNOSTIC DATA (LABS, IMAGING, TESTING) - I reviewed patient records, labs, notes, testing and imaging myself where available.  Lab Results  Component Value Date   WBC 11.2 (H) 04/10/2016   HGB 14.9 12/16/2014   HCT 43.7 04/10/2016   MCV 91 04/10/2016   PLT 337 04/10/2016       ASSESSMENT AND PLAN  Multiple sclerosis (HCC) - Plan: Ambulatory referral to Urology, CBC with Differential/Platelet  Abnormal gait  Disturbed cognition  Other fatigue  Urinary dysfunction - Plan: Ambulatory referral to Urology    1.   Continue Tecfidera for now.   Check CBC with differential to assess for lymphopenia. If present, consider switching medications sooner. We have discussed ocrelizumab in detail.   However, she is reluctant to switch at this time. 2.  Continue  Ritalin.  Advised to split the 2 Ritalin tablets to 1 in the morning and 14 hours later..  New script.    3.   Valium 5 mg nightly to help with insomnia and spasticity.  4.    Trial of Myrbetriq's for the bladder. I will also refer her to urology. She has hesitancy and frequency  are dynamics may be useful.   She will return to see me in about 4 months or sooner if she has any new or worsening neurologic dysfunction.    We had a long 45 minute office visit face-to-face with greater than 50% of the time counseling and coordinating care about her MS.     Cassey Bacigalupo A. Epimenio FootSater, MD, PhD 10/23/2016, 7:26 PM Certified in Neurology, Clinical Neurophysiology, Sleep Medicine, Pain Medicine and Neuroimaging  [facility] [address], [address] [place], [place] [address] [phone]  0

## 2016-10-24 ENCOUNTER — Telehealth: Payer: Self-pay | Admitting: *Deleted

## 2016-10-24 LAB — CBC WITH DIFFERENTIAL/PLATELET
Basophils Absolute: 0 10*3/uL (ref 0.0–0.2)
Basos: 0 %
EOS (ABSOLUTE): 0.1 10*3/uL (ref 0.0–0.4)
Eos: 1 %
Hematocrit: 42.6 % (ref 34.0–46.6)
Hemoglobin: 14.1 g/dL (ref 11.1–15.9)
Immature Grans (Abs): 0 10*3/uL (ref 0.0–0.1)
Immature Granulocytes: 0 %
Lymphocytes Absolute: 2.3 10*3/uL (ref 0.7–3.1)
Lymphs: 24 %
MCH: 29.9 pg (ref 26.6–33.0)
MCHC: 33.1 g/dL (ref 31.5–35.7)
MCV: 90 fL (ref 79–97)
Monocytes Absolute: 0.7 10*3/uL (ref 0.1–0.9)
Monocytes: 8 %
Neutrophils Absolute: 6.5 10*3/uL (ref 1.4–7.0)
Neutrophils: 67 %
Platelets: 287 10*3/uL (ref 150–379)
RBC: 4.71 x10E6/uL (ref 3.77–5.28)
RDW: 14 % (ref 12.3–15.4)
WBC: 9.7 10*3/uL (ref 3.4–10.8)

## 2016-10-24 NOTE — Telephone Encounter (Signed)
LMOM that per RAS, labs done in our office look good.  Continue Tecfidera as rx'd.  She does not need to return this call unless she has questions/fim

## 2016-10-24 NOTE — Telephone Encounter (Signed)
-----   Message from [name], MD sent at 10/24/2016  1:00 PM EST ----- Please let her know that the blood work looks good.

## 2016-11-01 ENCOUNTER — Telehealth: Payer: Self-pay | Admitting: *Deleted

## 2016-11-01 NOTE — Telephone Encounter (Signed)
Pt [employer] form on [employer] desk.

## 2016-11-06 ENCOUNTER — Telehealth: Payer: Self-pay | Admitting: *Deleted

## 2016-11-06 NOTE — Telephone Encounter (Signed)
FMLA paperwork completed and [facility] to [facility] [employer] fax# [phone].  Copy mailed to pt's home address for her records/fim

## 2016-11-08 NOTE — Telephone Encounter (Signed)
I have spoken with Katherine Cameron at [facility], and [facility] paperwork has been amended as requested, refaxed to [employer]/fim

## 2016-11-08 NOTE — Telephone Encounter (Signed)
Patient is calling about FMLA paperwork. She received a call from [place] and they need a date to return to work even though she is not returning or could be her next appointment date. Please call and discuss.

## 2016-11-08 NOTE — Telephone Encounter (Signed)
Pt called to advise - [employer] # [phone].

## 2016-11-24 ENCOUNTER — Encounter (HOSPITAL_BASED_OUTPATIENT_CLINIC_OR_DEPARTMENT_OTHER): Payer: Self-pay | Admitting: *Deleted

## 2016-11-24 ENCOUNTER — Emergency Department (HOSPITAL_BASED_OUTPATIENT_CLINIC_OR_DEPARTMENT_OTHER)
Admission: EM | Admit: 2016-11-24 | Discharge: 2016-11-24 | Disposition: A | Discharge status: Home / Self Care | Payer: Managed Care, Other (non HMO) | Attending: Emergency Medicine | Admitting: Emergency Medicine

## 2016-11-24 DIAGNOSIS — W1809XA Striking against other object with subsequent fall, initial encounter: Secondary | ICD-10-CM | POA: Insufficient documentation

## 2016-11-24 DIAGNOSIS — S0101XA Laceration without foreign body of scalp, initial encounter: Secondary | ICD-10-CM | POA: Diagnosis not present

## 2016-11-24 DIAGNOSIS — Y999 Unspecified external cause status: Secondary | ICD-10-CM | POA: Insufficient documentation

## 2016-11-24 DIAGNOSIS — Z79899 Other long term (current) drug therapy: Secondary | ICD-10-CM | POA: Diagnosis not present

## 2016-11-24 DIAGNOSIS — Y92003 Bedroom of unspecified non-institutional (private) residence as the place of occurrence of the external cause: Secondary | ICD-10-CM | POA: Insufficient documentation

## 2016-11-24 DIAGNOSIS — S0990XA Unspecified injury of head, initial encounter: Secondary | ICD-10-CM | POA: Diagnosis present

## 2016-11-24 DIAGNOSIS — Z87891 Personal history of nicotine dependence: Secondary | ICD-10-CM | POA: Insufficient documentation

## 2016-11-24 DIAGNOSIS — Y9301 Activity, walking, marching and hiking: Secondary | ICD-10-CM | POA: Insufficient documentation

## 2016-11-24 DIAGNOSIS — W19XXXA Unspecified fall, initial encounter: Secondary | ICD-10-CM

## 2016-11-24 HISTORY — DX: Spontaneous tension pneumothorax: J93.0

## 2016-11-24 NOTE — ED Triage Notes (Signed)
Patient states she fell while walking back to bed, striking the posterior head on the nightstand.  History of MS and frequent "controlled falls".  Denies loc.

## 2016-11-24 NOTE — ED Provider Notes (Signed)
[facility] Provider Note   CSN: [id number] Arrival date & time: 11/24/16  0805     History   Chief Complaint Chief Complaint  Patient presents with  . Fall  . Laceration    head lac    HPI Katherine Nippereke Cameron is a 45 y.o. female.  HPI Patient has MS. She reports normally she is supposed use her roll later but thought she could go 5 steps to go to the bathroom and back again. She reports that she lost her balance and fell in her bedroom striking the back of her head on her nightstand table. She reports there was a cut. No loss of consciousness. She reports some generalized pain and headache in the back of her head. No confusion or neurologic change her from baseline. Patient is not on any blood thinners or anticoagulants. Past Medical History:  Diagnosis Date  . Movement disorder   . Multiple sclerosis (HCC)   . Neuropathy (HCC)   . Pneumothorax, spontaneous, tension    Left x 2  . Vision abnormalities     Patient Active Problem List   Diagnosis Date Noted  . High risk medication use 04/10/2016  . Disturbed cognition 04/29/2015  . Other fatigue 04/29/2015  . Fatigue 04/29/2015  . Cognitive decline 04/29/2015  . Clinical depression 12/16/2014  . Urinary dysfunction 12/16/2014  . DDD (degenerative disc disease), lumbosacral 08/28/2014  . Dropfoot 08/28/2014  . Cannot sleep 08/28/2014  . Lumbosacral spondylosis 08/28/2014  . Multiple sclerosis (HCC) 08/28/2014  . Loss of feeling or sensation 08/28/2014  . 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 Pain in thoracic spine 08/28/2014  . Degeneration of intervertebral disc of lumbosacral region 08/28/2014  . 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 Abnormal gait 07/07/2014  . Dysthymia 07/07/2014    Past Surgical History:  Procedure Laterality Date  . PLEURAL SCARIFICATION     collasped lung     OB History    No data available       Home Medications    Prior to Admission medications   Medication Sig Start  Date End Date Taking? Authorizing Provider  Alpha Lipoic Acid 200 MG CAPS Take 2 tablets daily 08/12/15  Yes Asa Lenteichard A Sater, MD  AMPYRA 10 MG TB12 TAKE 1 TABLET BY MOUTH TWICE DAILY 09/25/16  Yes Asa Lenteichard A Sater, MD  baclofen (LIORESAL) 10 MG tablet Take 1 tablet (10 mg total) by mouth 3 (three) times daily as needed for muscle spasms. 12/19/15  Yes Asa Lenteichard A Sater, MD  Biotin 300 MCG TABS Take 1 tablet (300 mcg total) by mouth 3 (three) times daily. 08/12/15  Yes Asa Lenteichard A Sater, MD  Cholecalciferol (VITAMIN D3) 5000 UNITS CAPS Take 1 capsule (5,000 Units total) by mouth daily. 08/11/15  Yes Asa Lenteichard A Sater, MD  co-enzyme Q-10 50 MG capsule Take 50 mg by mouth daily.   Yes Historical Provider, MD  diazepam (VALIUM) 5 MG tablet Take 1 tablet (5 mg total) by mouth at bedtime as needed for anxiety. 10/23/16  Yes Asa Lenteichard A Sater, MD  escitalopram (LEXAPRO) 10 MG tablet TAKE 1 TABLET BY MOUTH DAILY 08/22/16  Yes Asa Lenteichard A Sater, MD  EVENING PRIMROSE OIL PO Take by mouth.   Yes Historical Provider, MD  imipramine (TOFRANIL) 25 MG tablet TAKE 1 TO 2 TABLETS(25 TO 50 MG) BY MOUTH AT BEDTIME 09/24/16  Yes Asa Lenteichard A Sater, MD  methylphenidate (RITALIN) 20 MG tablet Take one tablet twice daily, with breakfast and lunch 10/23/16  Yes Richard  A Sater, MD  tamsulosin (FLOMAX) 0.4 MG CAPS capsule TAKE 1 CAPSULE BY MOUTH DAILY 08/22/16  Yes [name], MD  TECFIDERA 240 MG CPDR Take 1 capsule (240 mg total) by mouth 2 (two) times daily. 07/10/16  Yes [name], MD  tiZANidine (ZANAFLEX) 4 MG tablet Take 1 tablet (4 mg total) by mouth 3 (three) times daily as needed for muscle spasms. 10/09/16  Yes [name], MD  Docosahexaenoic Acid (DHA PO) Take by mouth.    Historical Provider, MD  ibuprofen (ADVIL,MOTRIN) 600 MG tablet Take 1 tablet (600 mg total) by mouth every 6 (six) hours as needed. 10/19/13   Junius Finner, PA-C  mirabegron ER (MYRBETRIQ) 25 MG TB24 tablet Take 1 tablet (25 mg total) by mouth daily.  10/23/16   [name], MD    Family History Family History  Problem Relation Age of Onset  . Hypertension Mother   . Hyperlipidemia Mother   . Colon cancer Mother   . COPD Father   . Multiple sclerosis Sister     Social History Social History  Substance Use Topics  . Smoking status: Former Smoker    Types: Cigarettes  . Smokeless tobacco: Former Neurosurgeon    Quit date: 12/03/2014  . Alcohol use No     Allergies   Patient has no known allergies.   Review of Systems Review of Systems Constitutional: No fevers no chills no generalized illness Respiratory: No chest pain no shortness of breath.  Physical Exam Updated Vital Signs BP 122/81 (BP Location: Left Arm)   Pulse 88   Temp 98.1 F (36.7 C) (Oral)   Resp 18   Ht 5\' 5"  (1.651 m)   Wt 128 lb (58.1 kg)   LMP 11/03/2016 (Exact Date)   SpO2 100%   BMI 21.30 kg/m   Physical Exam  Constitutional: She is oriented to person, place, and time. She appears well-developed and well-nourished. No distress.  HENT:  Mouth/Throat: Oropharynx is clear and moist.  5 mm laceration to occiput. This is just to the deep dermis. No active bleeding and no hematoma.  Bilateral TMs normal. Oral cavity normal no dental injury.  Eyes: EOM are normal. Pupils are equal, round, and reactive to light.  Neck: Neck supple.  No C-spine tenderness.  Pulmonary/Chest: Effort normal.  Musculoskeletal:  No bony point tenderness to the thorax or lumbar spine.  Neurological: She is alert and oriented to person, place, and time. She exhibits normal muscle tone.  Skin: Skin is warm and dry.  Psychiatric: She has a normal mood and affect.     ED Treatments / Results  Labs (all labs ordered are listed, but only abnormal results are displayed) Labs Reviewed - No data to display  EKG  EKG Interpretation None       Radiology No results found.  Procedures Procedures (including critical care time)  Medications Ordered in ED Medications  - No data to display   Initial Impression / Assessment and Plan / ED Course  I have reviewed the triage vital signs and the nursing notes.  Pertinent labs & imaging results that were available during my care of the patient were reviewed by me and considered in my medical decision making (see chart for details).  Clinical Course      Final Clinical Impressions(s) / ED Diagnoses   Final diagnoses:  Injury of head, initial encounter  Fall, initial encounter  Scalp laceration, initial encounter   Patient had a fall with  very minor scalp laceration not requiring repair. No signs of intracranial injury. Head injury precautions are provided. New Prescriptions New Prescriptions   No medications on file     Arby Barrette, MD 11/24/16 [phone]

## 2016-11-29 ENCOUNTER — Telehealth: Payer: Self-pay | Admitting: Neurology

## 2016-11-29 DIAGNOSIS — G35 Multiple sclerosis: Secondary | ICD-10-CM

## 2016-11-29 MED ORDER — PREDNISONE 10 MG PO TABS: ORAL_TABLET | ORAL | 0 refills | Status: DC

## 2016-11-29 NOTE — Telephone Encounter (Signed)
Dr. Anne Hahn,  This patient feels that she is having a MS exacerbation over the past couple of weeks. She has been falling more, less balance, and "lethargic speech". She fell last week, hit her head and went to ED for evaluation. Patient states that Dr. Epimenio Foot usually recommends Solu-medrol infusions. She asked to get them at home because of lack of mobility and will need a driver.  We were unable to find a service that will do in-home infusions for her and her insurance. What do you recommend?

## 2016-11-29 NOTE — Telephone Encounter (Signed)
I spoke to patient. She states that she feels that she is having a possible MS exacerbation. Reports that she has been falling a lot in the last 2 weeks. She fell last week, hit her head, and went to the ED for it. Also states that her speech is "lethargic" and feels off balance.  Judieth says that Dr. Epimenio Foot will usually prescribe 1000mg  Solu-medrol IV x3 days. She is hoping to get the infusions at home due to mobility issues. How do you advise?

## 2016-11-29 NOTE — Telephone Encounter (Signed)
Called and spoke with Suezanne CheshireEdwinna Swan with [facility], She stated pt insurance [employer]selling [employer]) is not in network with home helath agencies. [facility] or [facility]. She recommended to call pharmacy to see if they are contracted with someone specific to do infusion. She states even the hospital has a hard time doing infusions for pt with this insurance.

## 2016-11-29 NOTE — Addendum Note (Signed)
Addended by: Stephanie Acre on: 11/29/2016 06:45 PM   Modules accepted: Orders

## 2016-11-29 NOTE — Telephone Encounter (Signed)
I called the patient. Apparently her insurance does not cover home health IV infusions. We may be a will to get IV Solu-Medrol to the office, but we are going into a long weekend. I will call in a prednisone Dosepak for now, if the patient is not doing well with this, we may be a will get her in next week for IV Solu-Medrol through the office.  The patient reports no evidence of fevers, chills, or evidence of bladder infection.

## 2016-11-29 NOTE — Telephone Encounter (Signed)
Please  Arrange for in home solumedrol. 1000 mg three days in a row.

## 2016-11-29 NOTE — Telephone Encounter (Signed)
Pt called stated she has been falling and having slurred speech for the last week and half.  Pt is requesting solu-medrol injection due to possible exacerbation. Please call

## 2016-12-06 ENCOUNTER — Telehealth: Payer: Self-pay | Admitting: Neurology

## 2016-12-06 NOTE — Telephone Encounter (Signed)
I have spoken with Dena this evening.  She is falling more.  Sts. based on prior discussions with RAS, she and her husband have decided she would like to switch from [place] to [place].  She has not signed a [facility] srf, so if RAS is ok with change in meds, she will need to sign srf/fim

## 2016-12-06 NOTE — Telephone Encounter (Signed)
Patient is calling to discuss starting [facility].

## 2016-12-07 NOTE — Telephone Encounter (Signed)
I have spoken with Katherine Cameron this afternoon and per RAS, advised ok for Lemtrada.  When RAS returns to the office on Monday, he will review her chart to make sure all nec. labs have been done.  If she needs to come in for additional labs, she will sign the srf at that time.  If she doesn't need to come in for additional labwork, I will mail srf to her and she will sign and mail it back/fim

## 2016-12-10 ENCOUNTER — Other Ambulatory Visit: Payer: Self-pay | Admitting: *Deleted

## 2016-12-10 DIAGNOSIS — Z79899 Other long term (current) drug therapy: Secondary | ICD-10-CM

## 2016-12-10 DIAGNOSIS — G35 Multiple sclerosis: Secondary | ICD-10-CM

## 2016-12-10 NOTE — Telephone Encounter (Signed)
I have spoken with Katherine Cameron this afternoon and per RAS, explained she will need TSH, CBC with diff, CMP, U/A prior to starting [place].  She verbalized understanding of same.  I have offered to send lab orders to [facility] in [facility], but she sts. she will come to our office/fim

## 2016-12-11 ENCOUNTER — Telehealth: Payer: Self-pay | Admitting: Neurology

## 2016-12-11 NOTE — Telephone Encounter (Signed)
Patients husband [name] called back stating patient would like to have her labs done at [facility] please. Please call

## 2016-12-11 NOTE — Telephone Encounter (Signed)
I have spoken with [name] this afternoon.  he sts. Orah would like labs done at [facility] in [place], at [address].  Phone# [phone]. I will fax orders to them/fim

## 2016-12-12 NOTE — Telephone Encounter (Signed)
Pt said she did great while taking prednisone. She finished 2 days ago and is back to where she was before starting it. She is wanting to use solumedrol.

## 2016-12-12 NOTE — Telephone Encounter (Signed)
I have spoken with Janat this afternoon, and per RAS, advised ok for 1 day of IV SM 1gm.  She is agreeable.  SM orders signed and given to [facility] with request to call pt. for an appt./fim

## 2016-12-14 ENCOUNTER — Other Ambulatory Visit (INDEPENDENT_AMBULATORY_CARE_PROVIDER_SITE_OTHER): Payer: Self-pay

## 2016-12-14 DIAGNOSIS — G35 Multiple sclerosis: Secondary | ICD-10-CM

## 2016-12-14 DIAGNOSIS — Z79899 Other long term (current) drug therapy: Secondary | ICD-10-CM

## 2016-12-14 DIAGNOSIS — Z0289 Encounter for other administrative examinations: Secondary | ICD-10-CM

## 2016-12-15 LAB — COMPREHENSIVE METABOLIC PANEL
ALT: 16 IU/L (ref 0–32)
AST: 18 IU/L (ref 0–40)
Albumin/Globulin Ratio: 2 (ref 1.2–2.2)
Albumin: 4.6 g/dL (ref 3.5–5.5)
Alkaline Phosphatase: 30 IU/L — ABNORMAL LOW (ref 39–117)
BUN/Creatinine Ratio: 20 (ref 9–23)
BUN: 13 mg/dL (ref 6–24)
Bilirubin Total: 0.4 mg/dL (ref 0.0–1.2)
CO2: 24 mmol/L (ref 18–29)
Calcium: 9.6 mg/dL (ref 8.7–10.2)
Chloride: 99 mmol/L (ref 96–106)
Creatinine, Ser: 0.64 mg/dL (ref 0.57–1.00)
GFR calc Af Amer: 125 mL/min/{1.73_m2} (ref >59)
GFR calc non Af Amer: 108 mL/min/{1.73_m2} (ref >59)
Globulin, Total: 2.3 g/dL (ref 1.5–4.5)
Glucose: 103 mg/dL — ABNORMAL HIGH (ref 65–99)
Potassium: 4.6 mmol/L (ref 3.5–5.2)
Sodium: 142 mmol/L (ref 134–144)
Total Protein: 6.9 g/dL (ref 6.0–8.5)

## 2016-12-15 LAB — URINALYSIS, ROUTINE W REFLEX MICROSCOPIC
Bilirubin, UA: NEGATIVE
Glucose, UA: NEGATIVE
Leukocytes, UA: NEGATIVE
Nitrite, UA: NEGATIVE
Protein, UA: NEGATIVE
RBC, UA: NEGATIVE
Specific Gravity, UA: 1.019 (ref 1.005–1.030)
Urobilinogen, Ur: 0.2 mg/dL (ref 0.2–1.0)
pH, UA: 6 (ref 5.0–7.5)

## 2016-12-15 LAB — CBC WITH DIFFERENTIAL/PLATELET
Basophils Absolute: 0 x10E3/uL (ref 0.0–0.2)
Basos: 0 %
EOS (ABSOLUTE): 0.2 x10E3/uL (ref 0.0–0.4)
Eos: 1 %
Hematocrit: 45.4 % (ref 34.0–46.6)
Hemoglobin: 14.8 g/dL (ref 11.1–15.9)
Immature Grans (Abs): 0.1 x10E3/uL (ref 0.0–0.1)
Immature Granulocytes: 1 %
Lymphocytes Absolute: 2.3 x10E3/uL (ref 0.7–3.1)
Lymphs: 18 %
MCH: 29.5 pg (ref 26.6–33.0)
MCHC: 32.6 g/dL (ref 31.5–35.7)
MCV: 91 fL (ref 79–97)
Monocytes Absolute: 0.7 x10E3/uL (ref 0.1–0.9)
Monocytes: 6 %
Neutrophils Absolute: 9.3 x10E3/uL — ABNORMAL HIGH (ref 1.4–7.0)
Neutrophils: 74 %
Platelets: 325 x10E3/uL (ref 150–379)
RBC: 5.01 x10E6/uL (ref 3.77–5.28)
RDW: 14.1 % (ref 12.3–15.4)
WBC: 12.5 x10E3/uL — ABNORMAL HIGH (ref 3.4–10.8)

## 2016-12-15 LAB — TSH: TSH: 0.04 u[IU]/mL — ABNORMAL LOW (ref 0.450–4.500)

## 2016-12-17 ENCOUNTER — Encounter: Payer: Self-pay | Admitting: *Deleted

## 2016-12-17 ENCOUNTER — Telehealth: Payer: Self-pay | Admitting: *Deleted

## 2016-12-17 DIAGNOSIS — R7989 Other specified abnormal findings of blood chemistry: Secondary | ICD-10-CM | POA: Insufficient documentation

## 2016-12-17 DIAGNOSIS — G35 Multiple sclerosis: Secondary | ICD-10-CM

## 2016-12-17 DIAGNOSIS — Z79899 Other long term (current) drug therapy: Secondary | ICD-10-CM

## 2016-12-17 LAB — SPECIMEN STATUS REPORT

## 2016-12-17 NOTE — Telephone Encounter (Signed)
-----   Message from [name], MD sent at 12/15/2016 10:50 AM EST ----- Please let Kathlynn know that her TSH was low implying that she has hyperthyroidism.   Please see if we can add T3, T3 reuptake and T4.      I was able to find some recent labs. It looked like she had a very low TSH when checked in mid 2017 but it was normal when checked once later. Now it is abnormal again     I would like her to see endocrinology (she was referred to regional endocrinology by her PCP after the first result but the visit was canceled after the second normal result).     This is not a contraindication for Lemtrada but we need to hold off until this has been evaluated by internal medicine or endocrinology. As part of monitoring for Lemtrada, we check a TSH every 3 months.   She should continue the Tecfidera until we get this worked out.    Hyperthyroidism occurs in 1 out of 15 people which is why this is tracked but is fairly common in the general population and a little higher in any MS patient.     After she sees endocrinology, she can let us know and I can look at the note in Epic

## 2016-12-17 NOTE — Telephone Encounter (Signed)
T3, T3 reuptake and T4 added to labs drawn on Friday.  I have spoken with Blimy and explained that since TSH was low, she must f/u with endocrinology to r/o hyperthyroidism prior to starting [place].  She verbalized understanding of same, asked if Biotin would cause low TSH.  Per RAS, I have advised it should not.  She will continue Tecfidera, f/u with endocrinology/fim

## 2016-12-18 LAB — T3 UPTAKE
Free Thyroxine Index: >7.8 — ABNORMAL HIGH (ref 1.2–4.9)
T3 Uptake Ratio: >56 % — ABNORMAL HIGH (ref 24–39)

## 2016-12-18 LAB — SPECIMEN STATUS REPORT

## 2016-12-18 LAB — T3: T3, Total: >651 ng/dL — ABNORMAL HIGH (ref 71–180)

## 2016-12-18 LAB — T4: T4, Total: 13.9 ug/dL — ABNORMAL HIGH (ref 4.5–12.0)

## 2016-12-18 NOTE — Telephone Encounter (Signed)
Patient called requesting a new order for thyroid levels please be placed to redo.  Patient stated she will stop Biotin and would like to have labs redrawn.  Please call

## 2016-12-18 NOTE — Telephone Encounter (Signed)
per RAS, ok for pt. to stop Biotin and have TSH redrawn in 2 weeks.  Orders in EPIC.  Pt. aware/fim

## 2016-12-18 NOTE — Addendum Note (Signed)
Addended by: Candis Schatz I on: 12/18/2016 03:23 PM   Modules accepted: Orders

## 2016-12-28 ENCOUNTER — Other Ambulatory Visit (INDEPENDENT_AMBULATORY_CARE_PROVIDER_SITE_OTHER): Payer: Self-pay

## 2016-12-28 DIAGNOSIS — Z0289 Encounter for other administrative examinations: Secondary | ICD-10-CM

## 2016-12-28 DIAGNOSIS — G35 Multiple sclerosis: Secondary | ICD-10-CM

## 2016-12-28 DIAGNOSIS — Z79899 Other long term (current) drug therapy: Secondary | ICD-10-CM

## 2016-12-29 LAB — TSH: TSH: 3.11 u[IU]/mL (ref 0.450–4.500)

## 2017-01-02 ENCOUNTER — Telehealth: Payer: Self-pay | Admitting: Neurology

## 2017-01-02 NOTE — Telephone Encounter (Signed)
I spoke to [facility]. The second TSH (off biotin) was normal.     We can go ahead and send in the [place] forms.

## 2017-01-04 DIAGNOSIS — Z0271 Encounter for disability determination: Secondary | ICD-10-CM

## 2017-01-07 ENCOUNTER — Other Ambulatory Visit: Payer: Self-pay | Admitting: Neurology

## 2017-01-22 ENCOUNTER — Telehealth: Payer: Self-pay | Admitting: Neurology

## 2017-01-22 NOTE — Telephone Encounter (Signed)
I have spoken with Katherine Cameron, and per RAS, explained there should be a 6 wk. washout from Tecfidera to [place].  She verbalized understanding of same./fim

## 2017-01-22 NOTE — Telephone Encounter (Signed)
Pt says she has been approved for lemtrada but does not remember if she should be off tecfidera for a period of time before starting. Please call

## 2017-02-20 ENCOUNTER — Ambulatory Visit (INDEPENDENT_AMBULATORY_CARE_PROVIDER_SITE_OTHER): Payer: Managed Care, Other (non HMO) | Admitting: Neurology

## 2017-02-20 ENCOUNTER — Encounter: Payer: Self-pay | Admitting: Neurology

## 2017-02-20 VITALS — BP 121/68 | HR 72 | Resp 16 | Ht 65.0 in | Wt 128.0 lb

## 2017-02-20 DIAGNOSIS — R5383 Other fatigue: Secondary | ICD-10-CM | POA: Diagnosis not present

## 2017-02-20 DIAGNOSIS — Z79899 Other long term (current) drug therapy: Secondary | ICD-10-CM

## 2017-02-20 DIAGNOSIS — R4189 Other symptoms and signs involving cognitive functions and awareness: Secondary | ICD-10-CM | POA: Diagnosis not present

## 2017-02-20 DIAGNOSIS — G35 Multiple sclerosis: Secondary | ICD-10-CM

## 2017-02-20 DIAGNOSIS — R269 Unspecified abnormalities of gait and mobility: Secondary | ICD-10-CM

## 2017-02-20 DIAGNOSIS — R39198 Other difficulties with micturition: Secondary | ICD-10-CM | POA: Diagnosis not present

## 2017-02-20 MED ORDER — VALACYCLOVIR HCL 500 MG PO TABS: 500.0000 mg | ORAL_TABLET | Freq: Two times a day (BID) | ORAL | 2 refills | Status: DC

## 2017-02-20 MED ORDER — METHYLPHENIDATE HCL 20 MG PO TABS: ORAL_TABLET | ORAL | 0 refills | Status: DC

## 2017-02-20 MED ORDER — DIAZEPAM 5 MG PO TABS: 5.0000 mg | ORAL_TABLET | Freq: Every evening | ORAL | 5 refills | Status: DC | PRN

## 2017-02-20 NOTE — Progress Notes (Signed)
[facility]  PATIENT: Katherine Cameron DOB: [birth year]  REFERRING CLINICIAN: Hedgecock, PA-C HISTORY FROM: Patient with input form husband  REASON FOR VISIT: MS and spastic gait   HISTORICAL  CHIEF COMPLAINT:  Chief Complaint  Patient presents with  . Multiple Sclerosis    Has stopped Tecfidera.  First Lemtrada infusion scheduled for 03-11-17.  She needs a r/f of Myrbetriq if RAS would like her to continue this/fim    HISTORY OF PRESENT ILLNESS:  Katherine Cameron is a 46 year old woman with MS since age 53.      MS:    She stopped Tecfidera dear 4 weeks ago and is scheduled to have her first Lemtrada injection in about 2 weeks..    She also takes Biotin 300 mg and tolerates it well.   Several months ago, she had significant worsening of her leg strength and gait. She does not feel that she recovered much from this exacerbation. MRI of the brain and cervical spine 09/10/2016 did not show any new lesions.   Gait/strength/sensation:   Gait is worse and she needs to use her scooter around the house. She can transfer by herself. Several months ago, she was able to walk with a walker or holding onto furniture around the house.   Bladder function:   She has a urinary hesitancy more than urinary frequency and urgency.  She has incontinence out of the house but not in the house.   She wears Depends out of the house. She has not seen urology.   Imipramine and Tamsulosin help only slightly. Vesicare did not help.   Myrbetriq's was not covered.  Vision:    She has decreased vision out of the left from old ON but it is stable  Fatigue/sleep:   Fatigue bothers her most days.      Ritalin 20-40 mg daily has helped..  Although this helps the fatigue, it causes heart palpitations at times.   Sleep is much better with tizanidine and valium  Mood/cognition:  Mood is doing better with Lexapro.  She denies current anxiety.  She has noted some cognitive decline.   She has reduced focus, STM and  she has trouble with verbal fluency.  She notes difficulty doing some cognitive tasks.   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   Ritalin has helped her focus and attention.  MS History:  She presented with right optic neuritis. She went to an ophthalmologist and then was referred to neurology where she had an MRI of the brain performed. The MRI was consistent with MS. She did not need to have a lumbar puncture. This was before FDA approved medications and she had several courses of IV steroids. Around 1994, she started Avonex after a large exacerbation around 2008, she went on Tysabri. She was more stable on Tysabri. Unfortunately, she had a a high titer JCV antibody. She was on Tysabri for about 3 years and then was switched to Tecfidera. She has been on Tecfidera for about 3 years. During the transition from Tysabri to [place], she had an especially severe exacerbation with significant worsening of her gait. A new focus was noted in the cervical spine.      REVIEW OF SYSTEMS:  Constitutional: No fevers, chills, sweats, or change in appetite.  She has insomnia Eyes: No visual changes, double vision, eye pain Ear, nose and throat: No hearing loss, ear pain,  nasal congestion, sore throat Cardiovascular: No chest pain, palpitations Respiratory:  No shortness of breath at rest or with exertion.   No wheezes GastrointestinaI: No nausea, vomiting, diarrhea, abdominal pain, fecal incontinence Genitourinary:  No dysuria, urinary retention or frequency.  No nocturia. Musculoskeletal:  No neck pain, back pain.  She has left > right hip pain Integumentary: No rash, pruritus, skin lesions Neurological: as above Psychiatric: No depression at this time.  No anxiety Endocrine: No palpitations, diaphoresis, change in appetite, change in weigh or increased  thirst Hematologic/Lymphatic:  No anemia, purpura, petechiae. Allergic/Immunologic: No itchy/runny eyes, nasal congestion, recent allergic reactions, rashes  ALLERGIES: No Known Allergies  HOME MEDICATIONS: Outpatient Medications Prior to Visit  Medication Sig Dispense Refill  . Alemtuzumab (LEMTRADA) 12 MG/1.2ML SOLN Inject 12 mg into the vein.    Allen Kell Lipoic Acid 200 MG CAPS Take 2 tablets daily 60 capsule 11  . AMPYRA 10 MG TB12 TAKE 1 TABLET BY MOUTH TWICE DAILY 180 tablet 1  . baclofen (LIORESAL) 10 MG tablet Take 1 tablet (10 mg total) by mouth 3 (three) times daily as needed for muscle spasms. 90 each 3  . Biotin 300 MCG TABS Take 1 tablet (300 mcg total) by mouth 3 (three) times daily. 90 tablet 11  . Cholecalciferol (VITAMIN D3) 5000 UNITS CAPS Take 1 capsule (5,000 Units total) by mouth daily. 90 capsule 3  . co-enzyme Q-10 50 MG capsule Take 50 mg by mouth daily.    . diazepam (VALIUM) 5 MG tablet Take 1 tablet (5 mg total) by mouth at bedtime as needed for anxiety. 30 tablet 5  . Docosahexaenoic Acid (DHA PO) Take by mouth.    . escitalopram (LEXAPRO) 10 MG tablet TAKE 1 TABLET BY MOUTH DAILY 90 tablet 2  . EVENING PRIMROSE OIL PO Take by mouth.    Marland Kitchen ibuprofen (ADVIL,MOTRIN) 600 MG tablet Take 1 tablet (600 mg total) by mouth every 6 (six) hours as needed. 30 tablet 0  . imipramine (TOFRANIL) 25 MG tablet TAKE 1 TO 2 TABLETS(25 TO 50 MG) BY MOUTH AT BEDTIME 180 tablet 0  . methylphenidate (RITALIN) 20 MG tablet Take one tablet twice daily, with breakfast and lunch 60 tablet 0  . predniSONE (DELTASONE) 10 MG tablet Begin taking 6 tablets daily, taper by one tablet every other day until off the medication. 42 tablet 0  . tamsulosin (FLOMAX) 0.4 MG CAPS capsule TAKE 1 CAPSULE BY MOUTH DAILY 90 capsule 2  . TECFIDERA 240 MG CPDR Take 1 capsule (240 mg total) by mouth 2 (two) times daily. 180 capsule 3  . tiZANidine (ZANAFLEX) 4 MG tablet Take 1 tablet (4 mg total) by mouth 3  (three) times daily as needed for muscle spasms. 90 tablet 3  . mirabegron ER (MYRBETRIQ) 25 MG TB24 tablet Take 1 tablet (25 mg total) by mouth daily. (Patient not taking: Reported on 02/20/2017) 30 tablet 5   No facility-administered medications prior to visit.   also on Tecfidera 240 mg bid, imipramine 25 mg at night and Ritalin 20 mg once or twice a day  PAST MEDICAL HISTORY: Past Medical History:  Diagnosis Date  . Movement disorder   . Multiple sclerosis (HCC)   . Neuropathy (HCC)   . Pneumothorax, spontaneous, tension    Left x 2  . Vision abnormalities     PAST SURGICAL HISTORY: Past Surgical History:  Procedure Laterality Date  . PLEURAL SCARIFICATION     collasped lung     FAMILY  HISTORY: Family History  Problem Relation Age of Onset  . Hypertension Mother   . Hyperlipidemia Mother   . Colon cancer Mother   . COPD Father   . Multiple sclerosis Sister     SOCIAL HISTORY:  Social History   Social History  . Marital status: Married    Spouse name: N/A  . Number of children: N/A  . Years of education: N/A   Occupational History  . Not on file.   Social History Main Topics  . Smoking status: Former Smoker    Types: Cigarettes  . Smokeless tobacco: Former Neurosurgeon    Quit date: 12/03/2014  . Alcohol use No  . Drug use: No  . Sexual activity: Yes    Birth control/ protection: Pill   Other Topics Concern  . Not on file   Social History Narrative  . No narrative on file     PHYSICAL EXAM  Vitals:   02/20/17 1610  BP: 121/68  Pulse: 72  Resp: 16  Weight: 128 lb (58.1 kg)  Height: 5\' 5"  (1.651 m)    Body mass index is 21.3 kg/m.   General: The patient is well-developed and well-nourished and in no acute distress   Neurologic Exam  Mental status: The patient is alert and oriented x 3 at the time of the examination. The patient has apparent normal recent and remote memory, with an apparently normal attention span and concentration ability.    Speech is normal.  Cranial nerves: Extraocular movements are full.   Facial symmetry is present. There is good facial sensation to soft touch bilaterally.Facial strength is normal.  Trapezius and sternocleidomastoid strength is normal. No dysarthria is noted.  The tongue is midline, and the patient has symmetric elevation of the soft palate. No obvious hearing deficits are noted.  Motor:  Muscle bulk is normal and tone is increased in both legs,  right > left.   There is minimal right arm increased tone. Strength is  5 / 5 in arms and poor in  Legs 2+/5 on right and 3 to 4-/5 on the left  Sensory: Sensory testing is intact to touch and vibration sensation in all 4 extremities.  Coordination: Cerebellar testing reveals good finger-nose-finger on the left but mildly reduced rapid alternating movements and FTN in right hand.   She cannot do heel-to-shin   Gait and station: Station is unstable.    She can transfer but not walk.    Reflexes: Deep tendon reflexes are increased, right > left.  Spread at the knees but no clonus    DIAGNOSTIC DATA (LABS, IMAGING, TESTING) - I reviewed patient records, labs, notes, testing and imaging myself where available.  Lab Results  Component Value Date   WBC 12.5 (H) 12/14/2016   HGB 14.9 12/16/2014   HCT 45.4 12/14/2016   MCV 91 12/14/2016   PLT 325 12/14/2016       ASSESSMENT AND PLAN  No diagnosis found.    1.   She will return in 2 weeks for Lemtrada. I wrote a prescription for Valtrex to take for herpes and shingles prophylaxis. 2.   Continue  Ritalin, renew .Marland Kitchen    3.   Valium 5 mg nightly to help with insomnia and spasticity.    4.    Trial of Myrbetriq's for the bladder. She has failed Vesicare and Flomax. If not better, refer to urology as urodynamics may be useful.   She will return for infusion and to see me in about  4 months or sooner if she has any new or worsening neurologic dysfunction.    We had a long 45 minute office visit  face-to-face with greater than 50% of the time counseling and coordinating care about her MS.     Roger Kettles A. Epimenio Foot, MD, PhD 02/20/2017, 4:42 PM Certified in Neurology, Clinical Neurophysiology, Sleep Medicine, Pain Medicine and Neuroimaging  [facility] [address] [place], [place] [address] [phone]  0

## 2017-02-26 ENCOUNTER — Other Ambulatory Visit: Payer: Self-pay | Admitting: Neurology

## 2017-02-26 NOTE — Telephone Encounter (Signed)
Pt called asking that faith calls back re: her request for a  Rx for Ditropan, she stated she was told she has to try ditropan before mirabegron ER (MYRBETRIQ) 25 MG TB24 tablet  Or the mirabegron ER (MYRBETRIQ) 25 MG TB24 tablet  Will cost a lot of money.  She is asking that it be called into [id number] - [place], [place] - [address]

## 2017-02-28 ENCOUNTER — Telehealth: Payer: Self-pay | Admitting: *Deleted

## 2017-02-28 NOTE — Telephone Encounter (Signed)
Short term disability paperwork completed and faxed back to [place], fax# [phone].  Copy mailed to pt's home address, and copy sent to [employer] to be scanned into EPIC/fim

## 2017-03-04 ENCOUNTER — Telehealth: Payer: Self-pay | Admitting: Neurology

## 2017-03-04 MED ORDER — OXYBUTYNIN CHLORIDE 5 MG PO TABS: 5.0000 mg | ORAL_TABLET | Freq: Three times a day (TID) | ORAL | 1 refills | Status: DC

## 2017-03-04 NOTE — Telephone Encounter (Signed)
Spoke with [facility] today and per RAS, escribed Ditropan 5mg  po tid to [facility] per her request/fim

## 2017-03-04 NOTE — Addendum Note (Signed)
Addended by: Candis Schatz I on: 03/04/2017 04:59 PM   Modules accepted: Orders

## 2017-03-04 NOTE — Telephone Encounter (Signed)
Patient calling to get a new Rx for Ditropan called to [facility] on [address] in [place]. She says insurance advised she needs to try this medication first before getting mirabegron ER (MYRBETRIQ) 25 MG TB24 tablet.

## 2017-03-14 ENCOUNTER — Telehealth: Payer: Self-pay | Admitting: *Deleted

## 2017-03-14 MED ORDER — IMIPRAMINE HCL 25 MG PO TABS: ORAL_TABLET | ORAL | 3 refills | Status: DC

## 2017-03-14 NOTE — Addendum Note (Signed)
Addended by: Candis Schatz I on: 03/14/2017 03:40 PM   Modules accepted: Orders

## 2017-03-14 NOTE — Telephone Encounter (Signed)
Imipramine r/f per pt's request/fim

## 2017-03-29 ENCOUNTER — Other Ambulatory Visit: Payer: Self-pay | Admitting: Neurology

## 2017-04-01 ENCOUNTER — Other Ambulatory Visit: Payer: Self-pay | Admitting: Neurology

## 2017-04-03 ENCOUNTER — Telehealth: Payer: Self-pay | Admitting: *Deleted

## 2017-04-03 NOTE — Telephone Encounter (Signed)
Ampyra PA completed and faxed to [facility] fax# [phone]/fim

## 2017-04-03 NOTE — Telephone Encounter (Signed)
PA approved by Rosann Auerbach, for dates 04/03/17 thru 04/03/18.  Pt. ID# [id number]. Request ID# [id number]

## 2017-04-03 NOTE — Telephone Encounter (Signed)
Fax received from [facility]. Ampyra PA approved for dates 04/03/17 thru 04/03/18.  Pt. ID# [id number]. Request ID# [id number]

## 2017-04-04 NOTE — Telephone Encounter (Signed)
Fax received from [employer]  (phone# [phone])stating this was a duplicate PA request. Jannifer Rodney has been approved for dates 04/03/17 thru 04/03/18./fim

## 2017-04-09 ENCOUNTER — Telehealth: Payer: Self-pay | Admitting: Neurology

## 2017-04-09 NOTE — Telephone Encounter (Signed)
Pt's husband called said he will be dropping off long term disability paperwork. He is aware it can take 10-14 days to be completed and $50 fee upon completion to be pd.  FYI

## 2017-04-09 NOTE — Telephone Encounter (Signed)
Noted/fim 

## 2017-04-11 NOTE — Telephone Encounter (Signed)
Patient called office returning RN's call.  Please call °

## 2017-04-11 NOTE — Telephone Encounter (Signed)
LMTC. I am going over her disability paperwork and have a couple of questions/fim

## 2017-04-11 NOTE — Telephone Encounter (Signed)
I have spoken with Jaedah.  Her [employer] paperwork is ready to be faxed in--I just need a fax #.  She will call back with fax #/fim

## 2017-04-11 NOTE — Telephone Encounter (Signed)
Pt's husband called back, fax # [phone].

## 2017-04-11 NOTE — Telephone Encounter (Signed)
Disability paperwork faxed to [facility] as requested, to fax # below/fim

## 2017-04-15 ENCOUNTER — Encounter: Payer: Self-pay | Admitting: Neurology

## 2017-04-17 ENCOUNTER — Telehealth: Payer: Self-pay | Admitting: Neurology

## 2017-04-17 NOTE — Telephone Encounter (Signed)
I have spoken with Katherine Cameron this morning and explained Lemtrada labs are not back yet.  Will call her when they are/fim

## 2017-04-17 NOTE — Telephone Encounter (Signed)
Pt request labs for lemtrada taken on Monday 04/15/17

## 2017-04-25 ENCOUNTER — Telehealth: Payer: Self-pay | Admitting: Neurology

## 2017-04-25 NOTE — Telephone Encounter (Signed)
LMOM for pt. that per RAS, as this is not something he rx's, he is not familiar with dosing.  I wish I had more information for her.  She does not need to return this call unless she has further questions/fim

## 2017-04-25 NOTE — Telephone Encounter (Signed)
Pt is wanting to know if she could take CBD oil and if so what would be dosing.

## 2017-04-29 ENCOUNTER — Other Ambulatory Visit: Payer: Self-pay | Admitting: Neurology

## 2017-05-03 ENCOUNTER — Telehealth: Payer: Self-pay | Admitting: Neurology

## 2017-05-03 NOTE — Telephone Encounter (Signed)
Pt checking on labs.

## 2017-05-06 NOTE — Telephone Encounter (Signed)
We  have not received [place] labs.  I have called [facility] and requested 04/15/17 labs be faxed to [place].  Our fax # changed in April, so forms completed prior to 4/20 may have had the old fax # on them.  Not sure if this is the problem/fim

## 2017-05-06 NOTE — Telephone Encounter (Signed)
error 

## 2017-05-07 ENCOUNTER — Ambulatory Visit: Payer: Self-pay | Admitting: Neurology

## 2017-05-07 NOTE — Telephone Encounter (Signed)
I have spoken with [facility] and advised I am still working on getting her last [place] labs.  I have reached out to [place] with [facility]aine [facility] again/fim

## 2017-05-07 NOTE — Telephone Encounter (Signed)
Still have not received Lemtrada lab results.  I have reached out to our [facility] rep, Arvella Merles, and asked her to check on this/fim

## 2017-05-10 ENCOUNTER — Other Ambulatory Visit: Payer: Self-pay | Admitting: Neurology

## 2017-05-15 NOTE — Telephone Encounter (Signed)
LMOM that per RAS; we received  Lemtrada labs this morning and per RAS, they are ok.  She does not need to return this call unless she has questions/fim

## 2017-05-16 ENCOUNTER — Encounter: Payer: Self-pay | Admitting: Neurology

## 2017-05-22 ENCOUNTER — Telehealth: Payer: Self-pay | Admitting: Neurology

## 2017-05-22 NOTE — Telephone Encounter (Signed)
I have spoken with [facility] and advised she should take Valtrex for 3-4 mos.  She has refills available and will continue this med/fim

## 2017-05-22 NOTE — Telephone Encounter (Signed)
Patient called office requesting returned called from RN to know how many weeks of the anti viral medication she is needing to take.  Please call

## 2017-06-04 ENCOUNTER — Telehealth: Payer: Self-pay | Admitting: Neurology

## 2017-06-04 NOTE — Telephone Encounter (Signed)
Per RAS, ok for 2 days of IV SoluMedrol.  Katherine Cameron is agreeable with this.  Orders with request to call [facility] to schedule have been placed in Tina's inbox in the infusion suite/fim

## 2017-06-04 NOTE — Telephone Encounter (Signed)
Pt is having a lot of falls and incontinence x 2 weeks. She is wanting an appt for solumedrol. Please call

## 2017-06-04 NOTE — Telephone Encounter (Signed)
I have spoken with Cashlynn this afternoon. She sts. she has been doing much better since Lemtrada infusions.  Even walking some without her rollator.  For the last 2 wks, she has had more generalized weakness, falls b/c of weakness,  incontinence (she ran out of her Oxybutynin, just started it back, so hoping incontinence will improve.)  She is wondering if IV steroids will help.  Will check with RAS and call her back/fim

## 2017-06-06 NOTE — Telephone Encounter (Signed)
Spoke to patient - she coming in today for her Solu-Medrol appt.

## 2017-06-06 NOTE — Telephone Encounter (Signed)
Left message for a return call

## 2017-06-11 ENCOUNTER — Telehealth: Payer: Self-pay | Admitting: Neurology

## 2017-06-11 NOTE — Telephone Encounter (Signed)
I have spoken with Katherine Cameron and per RAS, advised ok to stop Lexapro 10mg  once daily.  Does not need to wean off.  She verbalized understanding of same/fim

## 2017-06-11 NOTE — Telephone Encounter (Signed)
Patient called office in reference to escitalopram (LEXAPRO) 10 MG tablet.  Patient would like to discontinue it due to taking CBD oil and hoping the oil will take place of lexapro.  Patient would like to know how to discontinue.  Please call

## 2017-06-14 ENCOUNTER — Encounter: Payer: Self-pay | Admitting: Neurology

## 2017-06-25 ENCOUNTER — Encounter (INDEPENDENT_AMBULATORY_CARE_PROVIDER_SITE_OTHER): Payer: Self-pay

## 2017-06-25 ENCOUNTER — Encounter: Payer: Self-pay | Admitting: Neurology

## 2017-06-25 ENCOUNTER — Ambulatory Visit (INDEPENDENT_AMBULATORY_CARE_PROVIDER_SITE_OTHER): Payer: Managed Care, Other (non HMO) | Admitting: Neurology

## 2017-06-25 VITALS — BP 112/76 | HR 74 | Resp 14 | Ht 65.0 in | Wt 128.0 lb

## 2017-06-25 DIAGNOSIS — Z79899 Other long term (current) drug therapy: Secondary | ICD-10-CM

## 2017-06-25 DIAGNOSIS — R39198 Other difficulties with micturition: Secondary | ICD-10-CM

## 2017-06-25 DIAGNOSIS — G35 Multiple sclerosis: Secondary | ICD-10-CM

## 2017-06-25 DIAGNOSIS — R269 Unspecified abnormalities of gait and mobility: Secondary | ICD-10-CM

## 2017-06-25 DIAGNOSIS — R4189 Other symptoms and signs involving cognitive functions and awareness: Secondary | ICD-10-CM

## 2017-06-25 MED ORDER — OXYBUTYNIN CHLORIDE ER 15 MG PO TB24: 15.0000 mg | ORAL_TABLET | Freq: Every day | ORAL | 11 refills | Status: DC

## 2017-06-25 MED ORDER — BACLOFEN 10 MG PO TABS: 10.0000 mg | ORAL_TABLET | Freq: Three times a day (TID) | ORAL | 11 refills | Status: DC | PRN

## 2017-06-25 NOTE — Progress Notes (Signed)
[facility]  PATIENT: Katherine Cameron DOB: 1971/02/12  REFERRING CLINICIAN: Hedgecock, PA-C HISTORY FROM: Patient with input form husband  REASON FOR VISIT: MS and spastic gait   HISTORICAL  CHIEF COMPLAINT:  Chief Complaint  Patient presents with  . Multiple Sclerosis    1st yr. Lemtrada infusions were 08/11/17 thru 03/15/17.  Sts. in general has felt better since infusions.  Has intermittent bursts of energy, then wil feel fatigued again.  Needs Baclofen r/f/fim    HISTORY OF PRESENT ILLNESS:  Katherine Cameron is a 46 year old woman with MS since age 37.      MS:    She had her Lemtrada infusions in early April 2018. She tolerated the infusions fairly well and feels it has been mild benefit. Before Julaine Hua she was on Tecfidera.    She also took Biotin 300 mg and tolerates it well.   In September 2017, she had worsening leg function consistent with an exacerbation. However, MRI of the brain and cervical spine 09/10/2016 did not show any new lesions.   Gait/strength/sensation:   Gait is better since her [place].   There is still a lot of fluctuations but there are many more good days.    She can take a few steps with a walker some days.   She relies on the scooter around the house. She can transfer by herself. Several months ago, she was able to walk with a walker or holding onto furniture around the house.   Bladder function:   She has a urinary hesitancy more than urinary frequency and urgency.  She has incontinence out of the house but not in the house.   She wears Depends out of the house. She has not seen urology.   Oxybutynin hlpes the bladder some.   Vision:    She has decreased vision out of the left from old ON but it is stable  Fatigue/sleep:   Fatigue is bothersome some days.   She feels it is a little better with CBD oil.    Ritalin 20-40 mg daily has helped..  Although this helps the fatigue, it causes heart palpitations at times.   Sleep is much better with  tizanidine and valium and she is going to try to cut back some on the tizanidine  Mood/cognition:  Mood has done better this year.She stopped the Lexapro and feels she gets some benefit form CBD oil..  She denies current anxiety.  She has noted mild cognitive issues with reduced focus, STM and she has trouble with verbal fluency.  She notes difficulty doing some cognitive tasks.   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   Ritalin has helped her focus and attention but not to baseline.   She takes as needed.    MS History:  She presented with right optic neuritis. She went to an ophthalmologist and then was referred to neurology where she had an MRI of the brain performed. The MRI was consistent with MS. She did not need to have a lumbar puncture. This was before FDA approved medications and she had several courses of IV steroids. Around 1994, she started Avonex after a large exacerbation around 2008, she went on Tysabri. She was more stable on Tysabri. Unfortunately, she had a a high titer JCV antibody. She was on Tysabri for about 3 years and then was switched to Tecfidera. She has been on Tecfidera for about  3 years. During the transition from Tysabri to [place], she had an especially severe exacerbation with significant worsening of her gait. A new focus was noted in the cervical spine.      REVIEW OF SYSTEMS:  Constitutional: No fevers, chills, sweats, or change in appetite.  She has insomnia Eyes: No visual changes, double vision, eye pain Ear, nose and throat: No hearing loss, ear pain, nasal congestion, sore throat Cardiovascular: No chest pain, palpitations Respiratory:  No shortness of breath at rest or with exertion.   No wheezes GastrointestinaI: No nausea, vomiting, diarrhea, abdominal pain, fecal incontinence Genitourinary:  No dysuria, urinary retention or  frequency.  No nocturia. Musculoskeletal:  No neck pain, back pain.  She has left > right hip pain Integumentary: No rash, pruritus, skin lesions Neurological: as above Psychiatric: No depression at this time.  No anxiety Endocrine: No palpitations, diaphoresis, change in appetite, change in weigh or increased thirst Hematologic/Lymphatic:  No anemia, purpura, petechiae. Allergic/Immunologic: No itchy/runny eyes, nasal congestion, recent allergic reactions, rashes  ALLERGIES: No Known Allergies  HOME MEDICATIONS: Outpatient Medications Prior to Visit  Medication Sig Dispense Refill  . Alemtuzumab (LEMTRADA) 12 MG/1.2ML SOLN Inject 12 mg into the vein.    Allen Kell Lipoic Acid 200 MG CAPS Take 2 tablets daily 60 capsule 11  . AMPYRA 10 MG TB12 TAKE 1 TABLET BY MOUTH EVERY 12 HOURS 180 tablet 0  . Biotin 300 MCG TABS Take 1 tablet (300 mcg total) by mouth 3 (three) times daily. 90 tablet 11  . Cholecalciferol (VITAMIN D3) 5000 UNITS CAPS Take 1 capsule (5,000 Units total) by mouth daily. 90 capsule 3  . co-enzyme Q-10 50 MG capsule Take 50 mg by mouth daily.    . diazepam (VALIUM) 5 MG tablet Take 1 tablet (5 mg total) by mouth at bedtime as needed for anxiety. 30 tablet 5  . Docosahexaenoic Acid (DHA PO) Take by mouth.    . EVENING PRIMROSE OIL PO Take by mouth.    Marland Kitchen ibuprofen (ADVIL,MOTRIN) 600 MG tablet Take 1 tablet (600 mg total) by mouth every 6 (six) hours as needed. 30 tablet 0  . imipramine (TOFRANIL) 25 MG tablet TAKE 1 TO 2 TABLETS(25 TO 50 MG) BY MOUTH AT BEDTIME 180 tablet 3  . methylphenidate (RITALIN) 20 MG tablet Take one tablet twice daily, with breakfast and lunch 60 tablet 0  . tiZANidine (ZANAFLEX) 4 MG tablet TAKE 1 TABLET(4 MG) BY MOUTH THREE TIMES DAILY AS NEEDED FOR MUSCLE SPASMS 90 tablet 0  . baclofen (LIORESAL) 10 MG tablet Take 1 tablet (10 mg total) by mouth 3 (three) times daily as needed for muscle spasms. 90 each 3  . oxybutynin (DITROPAN) 5 MG tablet Take 1  tablet (5 mg total) by mouth 3 (three) times daily. 270 tablet 1  . TECFIDERA 240 MG CPDR Take 1 capsule (240 mg total) by mouth 2 (two) times daily. 180 capsule 3  . valACYclovir (VALTREX) 500 MG tablet Take 1 tablet (500 mg total) by mouth 2 (two) times daily. 60 tablet 2  . escitalopram (LEXAPRO) 10 MG tablet TAKE 1 TABLET BY MOUTH DAILY 90 tablet 2  . mirabegron ER (MYRBETRIQ) 25 MG TB24 tablet Take 1 tablet (25 mg total) by mouth daily. (Patient not taking: Reported on 02/20/2017) 30 tablet 5  . predniSONE (DELTASONE) 10 MG tablet Begin taking 6 tablets daily, taper by one tablet every other day until off the medication. 42 tablet 0  . tamsulosin (FLOMAX) 0.4 MG  CAPS capsule TAKE 1 CAPSULE BY MOUTH DAILY 90 capsule 2   No facility-administered medications prior to visit.   also on Tecfidera 240 mg bid, imipramine 25 mg at night and Ritalin 20 mg once or twice a day  PAST MEDICAL HISTORY: Past Medical History:  Diagnosis Date  . Movement disorder   . Multiple sclerosis (HCC)   . Neuropathy   . Pneumothorax, spontaneous, tension    Left x 2  . Vision abnormalities     PAST SURGICAL HISTORY: Past Surgical History:  Procedure Laterality Date  . PLEURAL SCARIFICATION     collasped lung     FAMILY HISTORY: Family History  Problem Relation Age of Onset  . Hypertension Mother   . Hyperlipidemia Mother   . Colon cancer Mother   . COPD Father   . Multiple sclerosis Sister     SOCIAL HISTORY:  Social History   Social History  . Marital status: Married    Spouse name: N/A  . Number of children: N/A  . Years of education: N/A   Occupational History  . Not on file.   Social History Main Topics  . Smoking status: Former Smoker    Types: Cigarettes  . Smokeless tobacco: Former Neurosurgeon    Quit date: 12/03/2014  . Alcohol use No  . Drug use: No  . Sexual activity: Yes    Birth control/ protection: Pill   Other Topics Concern  . Not on file   Social History Narrative  .  No narrative on file     PHYSICAL EXAM  Vitals:   06/25/17 1526  BP: 112/76  Pulse: 74  Resp: 14  Weight: 128 lb (58.1 kg)  Height: 5\' 5"  (1.651 m)    Body mass index is 21.3 kg/m.   General: The patient is well-developed and well-nourished and in no acute distress   Neurologic Exam  Mental status: The patient is alert and oriented x 3 at the time of the examination. The patient has apparent normal recent and remote memory, with an apparently normal attention span and concentration ability.   Speech is normal.  Cranial nerves: Extraocular movements are full.   Facial symmetry is present. There is good facial sensation to soft touch bilaterally.Facial strength is normal.  Trapezius and sternocleidomastoid strength is normal. No dysarthria is noted.  The tongue is midline, and the patient has symmetric elevation of the soft palate. No obvious hearing deficits are noted.  Motor:  Muscle bulk is normal and tone is increased in both legs,  right > left.   There is minimal right arm increased tone. Strength is  5 / 5 in arms and poor in  Legs 2+/5 on right and 3 to 4-/5 on the left  Sensory: Sensory testing is intact to touch and vibration sensation in all 4 extremities.  Coordination: Cerebellar testing reveals good finger-nose-finger on the left but mildly reduced rapid alternating movements and FTN in right hand.   She cannot do heel-to-shin   Gait and station: Station is unstable.    She can transfer but not walk.    Reflexes: Deep tendon reflexes are increased, right > left.  Spread at the knees but no clonus    DIAGNOSTIC DATA (LABS, IMAGING, TESTING) - I reviewed patient records, labs, notes, testing and imaging myself where available.  Lab Results  Component Value Date   WBC 12.5 (H) 12/14/2016   HGB 14.8 12/14/2016   HCT 45.4 12/14/2016   MCV 91 12/14/2016  PLT 325 12/14/2016       ASSESSMENT AND PLAN  Multiple sclerosis (HCC) - Plan: MR BRAIN W WO  CONTRAST, MR CERVICAL SPINE W WO CONTRAST  Abnormal gait - Plan: MR BRAIN W WO CONTRAST, MR CERVICAL SPINE W WO CONTRAST  High risk medication use  Disturbed cognition  Urinary dysfunction    1.   Continue Lemtrada. We will check an MRI of the brain and cervical spine to determine if there is any subclinical progression. If present, consider a change to ocrelizumab.  2.   Continue  Ritalin, renew .    3.   Continue nighttime Valium for insomnia and spasticity.  4.   If bladder worsens she will see urology.  She can try to taper off of the imipramine if she wants.  She will return for infusion and to see me in about 4 months or sooner if she has any new or worsening neurologic dysfunction.     Kenn Rekowski A. Epimenio Foot, MD, PhD 06/25/2017, 5:46 PM Certified in Neurology, Clinical Neurophysiology, Sleep Medicine, Pain Medicine and Neuroimaging  [facility] [address], [address] [place], [place] [address] [phone]  0

## 2017-07-22 ENCOUNTER — Other Ambulatory Visit: Payer: Self-pay | Admitting: Neurology

## 2017-08-01 ENCOUNTER — Ambulatory Visit
Admission: RE | Admit: 2017-08-01 | Discharge: 2017-08-01 | Disposition: A | Discharge status: Home / Self Care | Payer: Managed Care, Other (non HMO) | Source: Ambulatory Visit | Attending: Neurology | Admitting: Neurology

## 2017-08-01 DIAGNOSIS — R269 Unspecified abnormalities of gait and mobility: Secondary | ICD-10-CM

## 2017-08-01 DIAGNOSIS — G35 Multiple sclerosis: Secondary | ICD-10-CM

## 2017-08-01 MED ORDER — GADOBENATE DIMEGLUMINE 529 MG/ML IV SOLN
10.0000 mL | Freq: Once | INTRAVENOUS | Status: AC | PRN
  Administered 2017-08-01: 10 mL via INTRAVENOUS

## 2017-08-06 ENCOUNTER — Telehealth: Payer: Self-pay | Admitting: *Deleted

## 2017-08-06 DIAGNOSIS — K591 Functional diarrhea: Secondary | ICD-10-CM

## 2017-08-06 DIAGNOSIS — K921 Melena: Secondary | ICD-10-CM

## 2017-08-06 NOTE — Telephone Encounter (Signed)
Spoke with Katherine Cameron this afternoon and per RAS, advised no new lesions on MRI.  She verbalized understanding of same.  Sts. she is having diarrhea daily (small amt) with sm. amt. bright red blood noted in some of the stools.  Would like to see GI.  Referral placed in EPIC/fim

## 2017-08-06 NOTE — Telephone Encounter (Signed)
-----   Message from [name], MD sent at 08/02/2017  1:52 PM EDT ----- Please let Katherine Cameron know that the MRI of the brain and cervical spine are unchanged compared to the one from last year.

## 2017-08-08 NOTE — Telephone Encounter (Signed)
Noted referral has been sent to GI in [facility].

## 2017-08-21 ENCOUNTER — Encounter: Payer: Self-pay | Admitting: *Deleted

## 2017-08-22 ENCOUNTER — Encounter: Payer: Self-pay | Admitting: Neurology

## 2017-08-26 ENCOUNTER — Other Ambulatory Visit: Payer: Self-pay | Admitting: Neurology

## 2017-08-27 ENCOUNTER — Telehealth: Payer: Self-pay | Admitting: *Deleted

## 2017-08-27 MED ORDER — AMPYRA 10 MG PO TB12
10.0000 mg | ORAL_TABLET | Freq: Two times a day (BID) | ORAL | 0 refills | Status: DC
Start: 2017-08-27 — End: 2017-09-02

## 2017-08-27 NOTE — Telephone Encounter (Signed)
Ampyra escribed to [facility] per faxed request/fim

## 2017-09-02 ENCOUNTER — Other Ambulatory Visit: Payer: Self-pay | Admitting: Neurology

## 2017-09-03 ENCOUNTER — Telehealth: Payer: Self-pay | Admitting: Neurology

## 2017-09-03 MED ORDER — METHYLPHENIDATE HCL 20 MG PO TABS: ORAL_TABLET | ORAL | 0 refills | Status: DC

## 2017-09-03 NOTE — Telephone Encounter (Signed)
Rx. up front [facility]/fim 

## 2017-09-03 NOTE — Telephone Encounter (Signed)
Rx. awaiting RAS sig/fim 

## 2017-09-03 NOTE — Telephone Encounter (Signed)
Patient requesting refill of  methylphenidate (RITALIN) 20 MG tablet and methylphenidate (RITALIN) 20 MG tablet. The patient states her husband will come by this afternoon to have Dr. Epimenio Foot sign for a handicap placard.

## 2017-09-03 NOTE — Addendum Note (Signed)
Addended by: Candis Schatz I on: 09/03/2017 03:34 PM   Modules accepted: Orders

## 2017-09-24 ENCOUNTER — Telehealth: Payer: Self-pay | Admitting: Neurology

## 2017-09-24 MED ORDER — AMANTADINE HCL 100 MG PO CAPS: 100.0000 mg | ORAL_CAPSULE | Freq: Two times a day (BID) | ORAL | 3 refills | Status: DC

## 2017-09-24 NOTE — Addendum Note (Signed)
Addended by: Candis SchatzMISENHEIMER, Jmya Uliano I on: 09/24/2017 09:43 AM   Modules accepted: Orders

## 2017-09-24 NOTE — Telephone Encounter (Signed)
Patient is calling to discuss starting Amantadine for fatigue. She says she has taken it before and uses [facility] on [address] in [place].

## 2017-09-24 NOTE — Addendum Note (Signed)
Addended by: Candis SchatzMISENHEIMER, Luzelena Heeg I on: 09/24/2017 09:54 AM   Modules accepted: Orders

## 2017-09-24 NOTE — Telephone Encounter (Signed)
Spoke with [employer]. She would like to try Amantadine again for fatigue, and take less or none of the Ritalin.  Ok per RAS for Amantadine 100mg  bid.  Rx. escribed to [facility] per pt's request/fim

## 2017-09-30 ENCOUNTER — Other Ambulatory Visit: Payer: Self-pay | Admitting: Neurology

## 2017-10-10 ENCOUNTER — Telehealth: Payer: Self-pay | Admitting: Neurology

## 2017-10-10 NOTE — Telephone Encounter (Signed)
Spoke with Katherine Cameron--she would like to stop Ampyra, to see if she is really getting any benefit from it.  I have explained she can stop it without titration, and if she finds that it was helping, can restart it at same dose (10mg  bid).  She verbalized understanding of same/fim

## 2017-10-10 NOTE — Telephone Encounter (Signed)
Pt called she is wanting to stop taking AMPYRA 10 MG TB12. Please call to discuss

## 2017-10-11 ENCOUNTER — Encounter: Payer: Self-pay | Admitting: Neurology

## 2017-10-29 ENCOUNTER — Telehealth: Payer: Self-pay | Admitting: Neurology

## 2017-10-29 ENCOUNTER — Other Ambulatory Visit: Payer: Self-pay | Admitting: Neurology

## 2017-10-29 MED ORDER — DIAZEPAM 5 MG PO TABS: 5.0000 mg | ORAL_TABLET | Freq: Every evening | ORAL | 5 refills | Status: DC | PRN

## 2017-10-29 NOTE — Addendum Note (Signed)
Addended by: Candis Schatz I on: 10/29/2017 09:35 AM   Modules accepted: Orders

## 2017-10-29 NOTE — Telephone Encounter (Signed)
Rx. awaiting RAS sig/fim 

## 2017-10-29 NOTE — Telephone Encounter (Signed)
Rx. faxed to [facility]/fim 

## 2017-10-29 NOTE — Telephone Encounter (Signed)
Patient requesting refill of diazepam (VALIUM) 5 MG tablet.

## 2017-11-05 ENCOUNTER — Telehealth: Payer: Self-pay | Admitting: Neurology

## 2017-11-05 DIAGNOSIS — R269 Unspecified abnormalities of gait and mobility: Secondary | ICD-10-CM

## 2017-11-05 DIAGNOSIS — G35 Multiple sclerosis: Secondary | ICD-10-CM

## 2017-11-05 NOTE — Telephone Encounter (Signed)
Pt request referral to [facility]. Please call to advise

## 2017-11-05 NOTE — Telephone Encounter (Signed)
Spoke with [employer].  She requests referral to the [facility] for PT for gait/balance.  She has a friend that reported good results with PT at that facility.  Order placed in Epic/fim

## 2017-11-12 ENCOUNTER — Encounter: Payer: Self-pay | Admitting: Neurology

## 2017-11-14 ENCOUNTER — Telehealth: Payer: Self-pay | Admitting: Neurology

## 2017-11-14 NOTE — Telephone Encounter (Signed)
Pt is changing her insurance and is asking for a call from RN Faith re: her Alemtuzumab (LEMTRADA) 12 MG/1.2ML SOLN please call

## 2017-11-15 NOTE — Telephone Encounter (Signed)
Spoke with [facility] yesterday.  She would like to change from [facility]) to the [facility], b/c it is much cheaper.  Has called [facility] and was told Julaine Hua is not on their formulary.  2nd yr. Lemtrada infusions are due in April-May 2019.  I have spoken with Inetta Fermo in the infusion suite.  She is not aware of another [facility] infusion pt.  Maytte is aware we can't guarantee that we could get [facility] to approve her Lemtrada./fim

## 2017-11-27 ENCOUNTER — Other Ambulatory Visit: Payer: Self-pay | Admitting: Neurology

## 2017-12-16 ENCOUNTER — Encounter: Payer: Self-pay | Admitting: Neurology

## 2017-12-24 ENCOUNTER — Other Ambulatory Visit: Payer: Self-pay

## 2017-12-24 ENCOUNTER — Encounter: Payer: Self-pay | Admitting: Neurology

## 2017-12-24 ENCOUNTER — Ambulatory Visit (INDEPENDENT_AMBULATORY_CARE_PROVIDER_SITE_OTHER): Payer: Managed Care, Other (non HMO) | Admitting: Neurology

## 2017-12-24 VITALS — BP 118/84 | HR 146 | Resp 16 | Ht 65.0 in | Wt 128.0 lb

## 2017-12-24 DIAGNOSIS — R4189 Other symptoms and signs involving cognitive functions and awareness: Secondary | ICD-10-CM | POA: Diagnosis not present

## 2017-12-24 DIAGNOSIS — R39198 Other difficulties with micturition: Secondary | ICD-10-CM

## 2017-12-24 DIAGNOSIS — Z79899 Other long term (current) drug therapy: Secondary | ICD-10-CM

## 2017-12-24 DIAGNOSIS — G35 Multiple sclerosis: Secondary | ICD-10-CM

## 2017-12-24 DIAGNOSIS — R269 Unspecified abnormalities of gait and mobility: Secondary | ICD-10-CM

## 2017-12-24 DIAGNOSIS — R5383 Other fatigue: Secondary | ICD-10-CM | POA: Diagnosis not present

## 2017-12-24 MED ORDER — DIAZEPAM 5 MG PO TABS: 5.0000 mg | ORAL_TABLET | Freq: Every evening | ORAL | 5 refills | Status: DC | PRN

## 2017-12-24 NOTE — Progress Notes (Signed)
[facility]  PATIENT: Katherine Cameron DOB: [birth year]  REFERRING CLINICIAN: Hedgecock, PA-C HISTORY FROM: Patient with input form husband  REASON FOR VISIT: MS and spastic gait   HISTORICAL  CHIEF COMPLAINT:  Chief Complaint  Patient presents with  . Multiple Sclerosis    Sts. has tolerated Lemtrada well.  Year 1 infusions were given April 9-13, 2018.  Sts. she "just feels better". Takeing Amantadine for fatigue and feels it helps more than the Methylphenidate did.     HISTORY OF PRESENT ILLNESS:  Katherine Cameron is a 47 year old woman with MS since age 58.      Update 12/24/2017: She tolerated her Lemtrada infusions well. For a month she felt slightly worse but since then has felt much better and feels she is doing better than she has over the last several years. Specifically she is able to take some steps without using a cane or walker now. She still uses a scooter for longer distances.  Fatigue is still a problem but is doing better than it was last year. Additionally, mood is doing better. She has not had any new symptoms since the last visit.  She has felt an internal tremor (not manifested by actual tremor) x several years that seems worse.  It is in the arms and legs.   She has tachycardia today and has had off/on.   She had a Holter or other monitor many years ago and was briefly on beta blockers.     From 06/25/2017: MS:    She had her Lemtrada infusions in early April 2018. She tolerated the infusions fairly well and feels it has been mild benefit. Before Julaine Hua she was on Tecfidera.    She also took Biotin 300 mg and tolerates it well.   In September 2017, she had worsening leg function consistent with an exacerbation. However, MRI of the brain and cervical spine 09/10/2016 did not show any new lesions.   Gait/strength/sensation:   Gait is better since her [place].   There is still a lot of fluctuations but there are many more good days.    She can take a few  steps with a walker some days.   She relies on the scooter around the house. She can transfer by herself. Several months ago, she was able to walk with a walker or holding onto furniture around the house.   Bladder function:   She has a urinary hesitancy more than urinary frequency and urgency.  She has incontinence out of the house but not in the house.   She wears Depends out of the house. She has not seen urology.   Oxybutynin hlpes the bladder some.   Vision:    She has decreased vision out of the left from old ON but it is stable  Fatigue/sleep:   Fatigue is bothersome some days.   She feels it is a little better with CBD oil.    Ritalin 20-40 mg daily has helped..  Although this helps the fatigue, it causes heart palpitations at times.   Sleep is much better with tizanidine and valium and she is going to try to cut back some on the tizanidine  Mood/cognition:  Mood has done better this year.She stopped the Lexapro and feels she gets some benefit form CBD oil..  She denies current anxiety.  She has noted mild cognitive issues with reduced focus, STM and she has trouble with verbal fluency.  She notes difficulty doing some cognitive tasks.   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   Ritalin has helped her focus and attention but not to baseline.   She takes as needed.    MS History:  She presented with right optic neuritis. She went to an ophthalmologist and then was referred to neurology where she had an MRI of the brain performed. The MRI was consistent with MS. She did not need to have a lumbar puncture. This was before FDA approved medications and she had several courses of IV steroids. Around 1994, she started Avonex after a large exacerbation around 2008, she went on Tysabri. She was more stable on Tysabri. Unfortunately, she had a a high titer JCV antibody. She was on  Tysabri for about 3 years and then was switched to Tecfidera. She has been on Tecfidera for about 3 years. During the transition from Tysabri to [place], she had an especially severe exacerbation with significant worsening of her gait. A new focus was noted in the cervical spine.      REVIEW OF SYSTEMS:  Constitutional: No fevers, chills, sweats, or change in appetite.  She has insomnia Eyes: No visual changes, double vision, eye pain Ear, nose and throat: No hearing loss, ear pain, nasal congestion, sore throat Cardiovascular: No chest pain, palpitations Respiratory:  No shortness of breath at rest or with exertion.   No wheezes GastrointestinaI: No nausea, vomiting, diarrhea, abdominal pain, fecal incontinence Genitourinary:  No dysuria, urinary retention or frequency.  No nocturia. Musculoskeletal:  No neck pain, back pain.  She has left > right hip pain Integumentary: No rash, pruritus, skin lesions Neurological: as above Psychiatric: No depression at this time.  No anxiety Endocrine: No palpitations, diaphoresis, change in appetite, change in weigh or increased thirst Hematologic/Lymphatic:  No anemia, purpura, petechiae. Allergic/Immunologic: No itchy/runny eyes, nasal congestion, recent allergic reactions, rashes  ALLERGIES: No Known Allergies  HOME MEDICATIONS: Outpatient Medications Prior to Visit  Medication Sig Dispense Refill  . Alemtuzumab (LEMTRADA) 12 MG/1.2ML SOLN Inject 12 mg into the vein.    Allen Kell Lipoic Acid 200 MG CAPS Take 2 tablets daily 60 capsule 11  . amantadine (SYMMETREL) 100 MG capsule Take 1 capsule (100 mg total) by mouth 2 (two) times daily. 60 capsule 3  . AMPYRA 10 MG TB12 TAKE 1 TABLET BY MOUTH EVERY 12 HOURS 180 tablet 3  . Cholecalciferol (VITAMIN D3) 5000 UNITS CAPS Take 1 capsule (5,000 Units total) by mouth daily. 90 capsule 3  . co-enzyme Q-10 50 MG capsule Take 50 mg by mouth daily.    . diazepam (VALIUM) 5 MG tablet Take 1 tablet (5 mg  total) by mouth at bedtime as needed for anxiety. 30 tablet 5  . Docosahexaenoic Acid (DHA PO) Take by mouth.    . EVENING PRIMROSE OIL PO Take by mouth.    Marland Kitchen ibuprofen (ADVIL,MOTRIN) 600 MG tablet Take 1 tablet (600 mg total) by mouth every 6 (six) hours as needed. 30 tablet 0  . imipramine (TOFRANIL) 25 MG tablet TAKE 1 TO 2 TABLETS(25 TO 50 MG) BY MOUTH AT BEDTIME 180 tablet 3  . Melatonin 10 MG TABS Take 10 mg by mouth at bedtime.    Marland Kitchen oxybutynin (DITROPAN XL) 15 MG 24 hr tablet Take 1 tablet (15 mg total) by mouth at bedtime. 30 tablet 11  . UNABLE TO FIND Essential Amino Acids    . baclofen (LIORESAL) 10 MG tablet Take 1  tablet (10 mg total) by mouth 3 (three) times daily as needed for muscle spasms. 90 each 11  . Biotin 300 MCG TABS Take 1 tablet (300 mcg total) by mouth 3 (three) times daily. 90 tablet 11  . methylphenidate (RITALIN) 20 MG tablet Take one tablet twice daily, with breakfast and lunch 60 tablet 0  . tiZANidine (ZANAFLEX) 4 MG tablet TAKE 1 TABLET(4 MG) BY MOUTH THREE TIMES DAILY AS NEEDED FOR MUSCLE SPASMS 90 tablet 5   No facility-administered medications prior to visit.   also on Tecfidera 240 mg bid, imipramine 25 mg at night and Ritalin 20 mg once or twice a day  PAST MEDICAL HISTORY: Past Medical History:  Diagnosis Date  . Movement disorder   . Multiple sclerosis (HCC)   . Neuropathy   . Pneumothorax, spontaneous, tension    Left x 2  . Vision abnormalities     PAST SURGICAL HISTORY: Past Surgical History:  Procedure Laterality Date  . PLEURAL SCARIFICATION     collasped lung     FAMILY HISTORY: Family History  Problem Relation Age of Onset  . Hypertension Mother   . Hyperlipidemia Mother   . Colon cancer Mother   . COPD Father   . Multiple sclerosis Sister     SOCIAL HISTORY:  Social History   Socioeconomic History  . Marital status: Married    Spouse name: Not on file  . Number of children: Not on file  . Years of education: Not on  file  . Highest education level: Not on file  Social Needs  . Financial resource strain: Not on file  . Food insecurity - worry: Not on file  . Food insecurity - inability: Not on file  . Transportation needs - medical: Not on file  . Transportation needs - non-medical: Not on file  Occupational History  . Not on file  Tobacco Use  . Smoking status: Former Smoker    Types: Cigarettes  . Smokeless tobacco: Former Neurosurgeon    Quit date: 12/03/2014  Substance and Sexual Activity  . Alcohol use: No    Alcohol/week: 0.0 oz  . Drug use: No  . Sexual activity: Yes    Birth control/protection: Pill  Other Topics Concern  . Not on file  Social History Narrative  . Not on file     PHYSICAL EXAM  Vitals:   12/24/17 1547  BP: 118/84  Pulse: (!) 146  Resp: 16  Weight: 128 lb (58.1 kg)  Height: 5\' 5"  (1.651 m)   Pulse rechecked and was 116.  Body mass index is 21.3 kg/m.   General: The patient is well-developed and well-nourished and in no acute distress   Neurologic Exam  Mental status: The patient is alert and oriented x 3 at the time of the examination. The patient has apparent normal recent and remote memory, with an apparently normal attention span and concentration ability.   Speech is normal.  Cranial nerves: Extraocular movements are full.   Facial symmetry is present. There is good facial sensation to soft touch bilaterally.Facial strength is normal.  Trapezius and sternocleidomastoid strength is normal. No dysarthria is noted.  The tongue is midline, and the patient has symmetric elevation of the soft palate. No obvious hearing deficits are noted.  Motor:  Muscle bulk is normal and tone is increased in both legs,  right > left.   There is minimal right arm increased tone. Strength is  5 / 5 in arms and poor in  Legs 2+/5 on right and 3 to 4-/5 on the left  Sensory: Sensory testing is intact to touch and vibration sensation in all 4 extremities.  Coordination: Cerebellar  testing reveals good finger-nose-finger on the left but mildly reduced rapid alternating movements and FTN in right hand.   She cannot do heel-to-shin   Gait and station: Station is unstable.    She can transfer but not walk.    Reflexes: Deep tendon reflexes are increased, right > left.  Spread at the knees but no clonus    DIAGNOSTIC DATA (LABS, IMAGING, TESTING) - I reviewed patient records, labs, notes, testing and imaging myself where available.  Lab Results  Component Value Date   WBC 12.5 (H) 12/14/2016   HGB 14.8 12/14/2016   HCT 45.4 12/14/2016   MCV 91 12/14/2016   PLT 325 12/14/2016       ASSESSMENT AND PLAN  Multiple sclerosis (HCC)  Urinary dysfunction  Abnormal gait  Cognitive decline  High risk medication use  Other fatigue  Disturbed cognition    1.   Continue Lemtrada. Her next infusion will be around April 2019 .   She knows that she needs to remain compliant with the REMS program.   2.   Continue amantadine. .    3.   Renew nighttime Valium for insomnia and spasticity.  4.   She will return for infusion and to see me in about 6 months or sooner if she has any new or worsening neurologic dysfunction.     Kayle Passarelli A. Epimenio Foot, MD, PhD 12/24/2017, 3:57 PM Certified in Neurology, Clinical Neurophysiology, Sleep Medicine, Pain Medicine and Neuroimaging  [facility] [address], [address] [place], [place] [address] [phone]  0

## 2018-02-13 ENCOUNTER — Encounter: Payer: Self-pay | Admitting: *Deleted

## 2018-02-13 ENCOUNTER — Encounter: Payer: Self-pay | Admitting: Neurology

## 2018-02-13 ENCOUNTER — Telehealth: Payer: Self-pay | Admitting: Neurology

## 2018-02-13 NOTE — Telephone Encounter (Signed)
Letter awaiting RAS sig/fim 

## 2018-02-13 NOTE — Telephone Encounter (Signed)
Letter mailed to pt's home address/fim 

## 2018-02-13 NOTE — Telephone Encounter (Signed)
Pt called she needs a letter stating she can take [employer] so she can send it the [employer] [facility]. Please mail to her home address (address confirmed)

## 2018-03-19 ENCOUNTER — Other Ambulatory Visit: Payer: Self-pay | Admitting: Neurology

## 2018-03-31 ENCOUNTER — Other Ambulatory Visit: Payer: Self-pay | Admitting: *Deleted

## 2018-03-31 MED ORDER — VALACYCLOVIR HCL 500 MG PO TABS: 500.0000 mg | ORAL_TABLET | Freq: Two times a day (BID) | ORAL | 0 refills | Status: DC

## 2018-05-04 ENCOUNTER — Other Ambulatory Visit: Payer: Self-pay | Admitting: Neurology

## 2018-05-14 ENCOUNTER — Encounter: Payer: Self-pay | Admitting: Neurology

## 2018-05-28 ENCOUNTER — Telehealth: Payer: Self-pay | Admitting: Neurology

## 2018-05-28 NOTE — Telephone Encounter (Signed)
Pt request lab results from 5/14. Please call to advise

## 2018-05-28 NOTE — Telephone Encounter (Signed)
Spoke with [employer]. She c/o increased weakness bilat legs onset 2 weeks ago. Increased fatigue.  No sx. of UTI or other infection. Appt. with RAS given 05/29/18/fim

## 2018-05-29 ENCOUNTER — Ambulatory Visit (INDEPENDENT_AMBULATORY_CARE_PROVIDER_SITE_OTHER): Payer: Managed Care, Other (non HMO) | Admitting: Neurology

## 2018-05-29 ENCOUNTER — Encounter: Payer: Self-pay | Admitting: Neurology

## 2018-05-29 ENCOUNTER — Telehealth: Payer: Self-pay | Admitting: Neurology

## 2018-05-29 VITALS — BP 113/80 | HR 129 | Ht 65.0 in | Wt 121.5 lb

## 2018-05-29 DIAGNOSIS — R269 Unspecified abnormalities of gait and mobility: Secondary | ICD-10-CM | POA: Diagnosis not present

## 2018-05-29 DIAGNOSIS — R5383 Other fatigue: Secondary | ICD-10-CM

## 2018-05-29 DIAGNOSIS — R39198 Other difficulties with micturition: Secondary | ICD-10-CM

## 2018-05-29 DIAGNOSIS — G959 Disease of spinal cord, unspecified: Secondary | ICD-10-CM

## 2018-05-29 DIAGNOSIS — R3915 Urgency of urination: Secondary | ICD-10-CM

## 2018-05-29 DIAGNOSIS — R4189 Other symptoms and signs involving cognitive functions and awareness: Secondary | ICD-10-CM

## 2018-05-29 DIAGNOSIS — G35 Multiple sclerosis: Secondary | ICD-10-CM

## 2018-05-29 MED ORDER — BACLOFEN 10 MG PO TABS: 10.0000 mg | ORAL_TABLET | Freq: Three times a day (TID) | ORAL | 11 refills | Status: DC | PRN

## 2018-05-29 MED ORDER — DIAZEPAM 5 MG PO TABS: 5.0000 mg | ORAL_TABLET | Freq: Every evening | ORAL | 5 refills | Status: DC | PRN

## 2018-05-29 MED ORDER — PREDNISONE 50 MG PO TABS
ORAL_TABLET | ORAL | 0 refills | Status: DC
Start: 2018-05-29 — End: 2019-03-24

## 2018-05-29 NOTE — Progress Notes (Signed)
[facility]  PATIENT: Katherine Cameron DOB: Apr 20, 1971  REFERRING CLINICIAN: Hedgecock, PA-C HISTORY FROM: Patient with input form husband  REASON FOR VISIT: MS and spastic gait   HISTORICAL  CHIEF COMPLAINT:  Chief Complaint  Patient presents with  . Multiple Sclerosis    Patient reports that she has been having a lot more weakness in her legs.     HISTORY OF PRESENT ILLNESS:  Katherine Cameron is a 47 year old woman with MS since age 56.    Update 05/29/2018: She had her second course of Lemtrada the end of April.     She was feeling fairly good before the second course of Lemtrada.    However, she has had more fatigue and weakness the past month.  The right leg has always been weaker but now she is noting both legs seem heavy (right is still worse).   She feels like they are weighted down.   She is only able to take a few steps with her Rollator now but was able to walk 40-50 feet in April.    She also notes more mental fog.   Her last MRI was 07/2017 and showed no new lesions.       In general she is weaker in the evenings.     She feels vision is a little worse but her last ophtho appt showed no changes and she has the same lens script.     Her bladder urgency is much better with oxybutynin.    Sleep is worse.  She wakes up and then can;t fall back asleep.    If she sleeps poorly she has a bad day.   Melatonin helps her fall asleep.  She takes valium if she wakes up.     She notes a mental fog.   Ritalin hekped but caused her heart to race.    Amantadine also causes a mild increase in heart rate.       Update 12/24/2017: She tolerated her Lemtrada infusions well. For a month she felt slightly worse but since then has felt much better and feels she is doing better than she has over the last several years. Specifically she is able to take some steps without using a cane or walker now. She still uses a scooter for longer distances.  Fatigue is still a problem but is doing  better than it was last year. Additionally, mood is doing better. She has not had any new symptoms since the last visit.  She has felt an internal tremor (not manifested by actual tremor) x several years that seems worse.  It is in the arms and legs.   She has tachycardia today and has had off/on.   She had a Holter or other monitor many years ago and was briefly on beta blockers.     From 06/25/2017: MS:    She had her Lemtrada infusions in early April 2018. She tolerated the infusions fairly well and feels it has been mild benefit. Before Julaine Hua she was on Tecfidera.    She also took Biotin 300 mg and tolerates it well.   In September 2017, she had worsening leg function consistent with an exacerbation. However, MRI of the brain and cervical spine 09/10/2016 did not show any new lesions.   Gait/strength/sensation:   Gait is better since her [place].   There is still a lot of fluctuations but there are many more good days.    She can take a few steps with a walker  some days.   She relies on the scooter around the house. She can transfer by herself. Several months ago, she was able to walk with a walker or holding onto furniture around the house.   Bladder function:   She has a urinary hesitancy more than urinary frequency and urgency.  She has incontinence out of the house but not in the house.   She wears Depends out of the house. She has not seen urology.   Oxybutynin hlpes the bladder some.   Vision:    She has decreased vision out of the left from old ON but it is stable  Fatigue/sleep:   Fatigue is bothersome some days.   She feels it is a little better with CBD oil.    Ritalin 20-40 mg daily has helped..  Although this helps the fatigue, it causes heart palpitations at times.   Sleep is much better with tizanidine and valium and she is going to try to cut back some on the tizanidine  Mood/cognition:  Mood has done better this year.She stopped the Lexapro and feels she gets some benefit form  CBD oil..  She denies current anxiety.  She has noted mild cognitive issues with reduced focus, STM and she has trouble with verbal fluency.  She notes difficulty doing some cognitive tasks.   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   Ritalin has helped her focus and attention but not to baseline.   She takes as needed.    MS History:  She presented with right optic neuritis. She went to an ophthalmologist and then was referred to neurology where she had an MRI of the brain performed. The MRI was consistent with MS. She did not need to have a lumbar puncture. This was before FDA approved medications and she had several courses of IV steroids. Around 1994, she started Avonex after a large exacerbation around 2008, she went on Tysabri. She was more stable on Tysabri. Unfortunately, she had a a high titer JCV antibody. She was on Tysabri for about 3 years and then was switched to Tecfidera. She has been on Tecfidera for about 3 years. During the transition from Tysabri to [place], she had an especially severe exacerbation with significant worsening of her gait. A new focus was noted in the cervical spine.      REVIEW OF SYSTEMS:  Constitutional: No fevers, chills, sweats, or change in appetite.  She has insomnia Eyes: No visual changes, double vision, eye pain Ear, nose and throat: No hearing loss, ear pain, nasal congestion, sore throat Cardiovascular: No chest pain, palpitations Respiratory:  No shortness of breath at rest or with exertion.   No wheezes GastrointestinaI: No nausea, vomiting, diarrhea, abdominal pain, fecal incontinence Genitourinary:  No dysuria, urinary retention or frequency.  No nocturia. Musculoskeletal:  No neck pain, back pain.  She has left > right hip pain Integumentary: No rash, pruritus, skin lesions Neurological: as above Psychiatric: No  depression at this time.  No anxiety Endocrine: No palpitations, diaphoresis, change in appetite, change in weigh or increased thirst Hematologic/Lymphatic:  No anemia, purpura, petechiae. Allergic/Immunologic: No itchy/runny eyes, nasal congestion, recent allergic reactions, rashes  ALLERGIES: No Known Allergies  HOME MEDICATIONS: Outpatient Medications Prior to Visit  Medication Sig Dispense Refill  . Alemtuzumab (LEMTRADA) 12 MG/1.2ML SOLN Inject 12 mg into the vein.    Allen Kell Lipoic Acid 200 MG CAPS Take 2  tablets daily 60 capsule 11  . amantadine (SYMMETREL) 100 MG capsule TAKE 1 CAPSULE(100 MG) BY MOUTH TWICE DAILY 60 capsule 0  . Cholecalciferol (VITAMIN D3) 5000 UNITS CAPS Take 1 capsule (5,000 Units total) by mouth daily. 90 capsule 3  . co-enzyme Q-10 50 MG capsule Take 50 mg by mouth daily.    . Docosahexaenoic Acid (DHA PO) Take by mouth.    . EVENING PRIMROSE OIL PO Take by mouth.    Marland Kitchen ibuprofen (ADVIL,MOTRIN) 600 MG tablet Take 1 tablet (600 mg total) by mouth every 6 (six) hours as needed. 30 tablet 0  . imipramine (TOFRANIL) 25 MG tablet TAKE 1 TO 2 TABLETS(25 TO 50 MG) BY MOUTH AT BEDTIME 180 tablet 0  . Melatonin 10 MG TABS Take 10 mg by mouth at bedtime.    Marland Kitchen oxybutynin (DITROPAN XL) 15 MG 24 hr tablet Take 1 tablet (15 mg total) by mouth at bedtime. 30 tablet 11  . tiZANidine (ZANAFLEX) 4 MG tablet TAKE 1 TABLET(4 MG) BY MOUTH THREE TIMES DAILY AS NEEDED FOR MUSCLE SPASMS 90 tablet 5  . UNABLE TO FIND Essential Amino Acids    . baclofen (LIORESAL) 10 MG tablet Take 1 tablet (10 mg total) by mouth 3 (three) times daily as needed for muscle spasms. 90 each 11  . diazepam (VALIUM) 5 MG tablet Take 1 tablet (5 mg total) by mouth at bedtime as needed for anxiety. 30 tablet 5  . AMPYRA 10 MG TB12 TAKE 1 TABLET BY MOUTH EVERY 12 HOURS (Patient not taking: Reported on 05/29/2018) 180 tablet 3  . methylphenidate (RITALIN) 20 MG tablet Take one tablet twice daily, with breakfast  and lunch (Patient not taking: Reported on 05/29/2018) 60 tablet 0  . Biotin 300 MCG TABS Take 1 tablet (300 mcg total) by mouth 3 (three) times daily. 90 tablet 11  . valACYclovir (VALTREX) 500 MG tablet Take 1 tablet (500 mg total) by mouth 2 (two) times daily. 180 tablet 0   No facility-administered medications prior to visit.   also on Tecfidera 240 mg bid, imipramine 25 mg at night and Ritalin 20 mg once or twice a day  PAST MEDICAL HISTORY: Past Medical History:  Diagnosis Date  . Movement disorder   . Multiple sclerosis (HCC)   . Neuropathy   . Pneumothorax, spontaneous, tension    Left x 2  . Vision abnormalities     PAST SURGICAL HISTORY: Past Surgical History:  Procedure Laterality Date  . PLEURAL SCARIFICATION     collasped lung     FAMILY HISTORY: Family History  Problem Relation Age of Onset  . Hypertension Mother   . Hyperlipidemia Mother   . Colon cancer Mother   . COPD Father   . Multiple sclerosis Sister     SOCIAL HISTORY:  Social History   Socioeconomic History  . Marital status: Married    Spouse name: Not on file  . Number of children: Not on file  . Years of education: Not on file  . Highest education level: Not on file  Occupational History  . Not on file  Social Needs  . Financial resource strain: Not on file  . Food insecurity:    Worry: Not on file    Inability: Not on file  . Transportation needs:    Medical: Not on file    Non-medical: Not on file  Tobacco Use  . Smoking status: Former Smoker    Types: Cigarettes  . Smokeless tobacco: Former Neurosurgeon  Quit date: 12/03/2014  Substance and Sexual Activity  . Alcohol use: No    Alcohol/week: 0.0 oz  . Drug use: No  . Sexual activity: Yes    Birth control/protection: Pill  Lifestyle  . Physical activity:    Days per week: Not on file    Minutes per session: Not on file  . Stress: Not on file  Relationships  . Social connections:    Talks on phone: Not on file    Gets  together: Not on file    Attends religious service: Not on file    Active member of club or organization: Not on file    Attends meetings of clubs or organizations: Not on file    Relationship status: Not on file  . Intimate partner violence:    Fear of current or ex partner: Not on file    Emotionally abused: Not on file    Physically abused: Not on file    Forced sexual activity: Not on file  Other Topics Concern  . Not on file  Social History Narrative  . Not on file     PHYSICAL EXAM  Vitals:   05/29/18 1051  BP: 113/80  Pulse: (!) 129  Weight: 121 lb 8 oz (55.1 kg)  Height: 5\' 5"  (1.651 m)   Pulse rechecked and was 116.  Body mass index is 20.22 kg/m.   General: The patient is well-developed and well-nourished and in no acute distress   Neurologic Exam  Mental status: The patient is alert and oriented x 3 at the time of the examination. The patient has apparent normal recent and remote memory, with an apparently normal attention span and concentration ability.   Speech is normal.  Cranial nerves: Extraocular movements are full.  Facial strength and sensation is normal.. No dysarthria is noted.  The tongue is midline, and the patient has symmetric elevation of the soft palate. No obvious hearing deficits are noted.  Motor:  Muscle bulk is normal and tone is increased in both legs,  right > left.   There is minimal right arm increased tone. Strength is  5 / 5 in arms and poor in  Legs 2+ to 3/5 on right and 4-/5 on the left  Sensory: Sensory testing is intact to touch and vibration sensation in all 4 extremities.  Coordination: Cerebellar testing reveals good finger-nose-finger on the left but mildly reduced rapid alternating movements and FTN in right hand.   Heel-to-shin was reduced on the left and she cannot do the right  Gait and station: Station is unstable.   With unilateral support, she can walk about 10 feet and then the right leg starts to become too weak and  she needs bilateral support.  Reflexes: Deep tendon reflexes are increased, right > left.  Spread at the knees but no clonus    DIAGNOSTIC DATA (LABS, IMAGING, TESTING) - I reviewed patient records, labs, notes, testing and imaging myself where available.  Lab Results  Component Value Date   WBC 12.5 (H) 12/14/2016   HGB 14.8 12/14/2016   HCT 45.4 12/14/2016   MCV 91 12/14/2016   PLT 325 12/14/2016       ASSESSMENT AND PLAN  Multiple sclerosis (HCC) - Plan: MR BRAIN W WO CONTRAST, MR CERVICAL SPINE W WO CONTRAST, Urinalysis, Routine w reflex microscopic, Culture, Urine, Comprehensive metabolic panel, CBC with Differential/Platelet  Disease of spinal cord (HCC) - Plan: MR CERVICAL SPINE W WO CONTRAST  Urinary urgency - Plan: Urinalysis, Routine  w reflex microscopic, Culture, Urine, Comprehensive metabolic panel, CBC with Differential/Platelet  Abnormal gait  Urinary dysfunction  Other fatigue  Disturbed cognition    1.   She knows that she needs to remain compliant with the REMS program.   She will continue Valtrex for total 3 months.    I will check a UA, urine culture, CBC, CMP today 2.   Appears to be having a small exacerbation superimposed on her chronic right-sided weakness.  I will have her get 1 day of IV steroids followed by a high-dose oral taper.  Additionally we need to check an MRI of the brain and cervical spine to determine if she is having significant breakthrough disease and also to rule out a compressive myelopathy.   3.   Continue amantadine, she can take up to 3 times a day.   Renew nighttime Valium for insomnia and spasticity.  4.  Continue to be active 5.   She will return for her regular follow-up in 6 months but call sooner if she has any new or worsening neurologic symptoms.  40-minute face-to-face evaluation with greater than one half the time counseling or coordinating care about her new neurologic symptoms associated with her MS.  Chimere Klingensmith A.  Epimenio Foot, MD, PhD 05/29/2018, 1:06 PM Certified in Neurology, Clinical Neurophysiology, Sleep Medicine, Pain Medicine and Neuroimaging  [facility] [address] [place], [place] [address] [phone]  0

## 2018-05-29 NOTE — Telephone Encounter (Signed)
[employer] order sent to GI. They will obtain the auth and will reach out to the pt to schedule.  °

## 2018-05-30 ENCOUNTER — Telehealth: Payer: Self-pay | Admitting: *Deleted

## 2018-05-30 ENCOUNTER — Telehealth: Payer: Self-pay | Admitting: Neurology

## 2018-05-30 LAB — COMPREHENSIVE METABOLIC PANEL
ALT: 18 IU/L (ref 0–32)
AST: 16 IU/L (ref 0–40)
Albumin/Globulin Ratio: 2 (ref 1.2–2.2)
Albumin: 4.3 g/dL (ref 3.5–5.5)
Alkaline Phosphatase: 42 IU/L (ref 39–117)
BUN/Creatinine Ratio: 17 (ref 9–23)
BUN: 12 mg/dL (ref 6–24)
Bilirubin Total: 0.3 mg/dL (ref 0.0–1.2)
CO2: 23 mmol/L (ref 20–29)
Calcium: 9.4 mg/dL (ref 8.7–10.2)
Chloride: 102 mmol/L (ref 96–106)
Creatinine, Ser: 0.7 mg/dL (ref 0.57–1.00)
GFR calc Af Amer: 120 mL/min/{1.73_m2} (ref >59)
GFR calc non Af Amer: 104 mL/min/{1.73_m2} (ref >59)
Globulin, Total: 2.2 g/dL (ref 1.5–4.5)
Glucose: 89 mg/dL (ref 65–99)
Potassium: 4.3 mmol/L (ref 3.5–5.2)
Sodium: 139 mmol/L (ref 134–144)
Total Protein: 6.5 g/dL (ref 6.0–8.5)

## 2018-05-30 LAB — URINALYSIS, ROUTINE W REFLEX MICROSCOPIC
Bilirubin, UA: NEGATIVE
Glucose, UA: NEGATIVE
Ketones, UA: NEGATIVE
Leukocytes, UA: NEGATIVE
Nitrite, UA: NEGATIVE
Protein, UA: NEGATIVE
RBC, UA: NEGATIVE
Specific Gravity, UA: 1.011 (ref 1.005–1.030)
Urobilinogen, Ur: 0.2 mg/dL (ref 0.2–1.0)
pH, UA: 6 (ref 5.0–7.5)

## 2018-05-30 LAB — CBC WITH DIFFERENTIAL/PLATELET
Basophils Absolute: 0 10*3/uL (ref 0.0–0.2)
Basos: 0 %
EOS (ABSOLUTE): 0.1 10*3/uL (ref 0.0–0.4)
Eos: 1 %
Hematocrit: 40.9 % (ref 34.0–46.6)
Hemoglobin: 13.4 g/dL (ref 11.1–15.9)
Immature Grans (Abs): 0 10*3/uL (ref 0.0–0.1)
Immature Granulocytes: 0 %
Lymphocytes Absolute: 0.5 10*3/uL — ABNORMAL LOW (ref 0.7–3.1)
Lymphs: 7 %
MCH: 29.8 pg (ref 26.6–33.0)
MCHC: 32.8 g/dL (ref 31.5–35.7)
MCV: 91 fL (ref 79–97)
Monocytes Absolute: 0.5 10*3/uL (ref 0.1–0.9)
Monocytes: 7 %
Neutrophils Absolute: 6.3 10*3/uL (ref 1.4–7.0)
Neutrophils: 85 %
Platelets: 331 10*3/uL (ref 150–450)
RBC: 4.5 x10E6/uL (ref 3.77–5.28)
RDW: 14.9 % (ref 12.3–15.4)
WBC: 7.4 10*3/uL (ref 3.4–10.8)

## 2018-05-30 NOTE — Telephone Encounter (Signed)
Returned call to patient and reviewed Dr. Bonnita Hollow instructions for her Prednisone prescription.  She said the pharmacy had typed out the directions but the font was so small, she could not read it.  She wrote down the instructions and read them back to me.

## 2018-05-30 NOTE — Telephone Encounter (Signed)
Spoke to her husband, [name] (on [place]) - he is aware of lab results and will provide the information to [facility].

## 2018-05-30 NOTE — Telephone Encounter (Signed)
Pt is asking for a call to discuss how much and how often to take the predniSONE (DELTASONE) 50 MG tablet please call

## 2018-05-30 NOTE — Telephone Encounter (Signed)
-----   Message from [name], MD sent at 05/30/2018 10:39 AM EDT ----- Please let the patient know that the lab work is ok.   we'lll let her know next week about the urine culture (takes 3 days)

## 2018-05-31 LAB — URINE CULTURE

## 2018-06-04 ENCOUNTER — Ambulatory Visit
Admission: RE | Admit: 2018-06-04 | Discharge: 2018-06-04 | Disposition: A | Discharge status: Home / Self Care | Payer: Managed Care, Other (non HMO) | Source: Ambulatory Visit | Attending: Neurology | Admitting: Neurology

## 2018-06-04 DIAGNOSIS — G959 Disease of spinal cord, unspecified: Secondary | ICD-10-CM

## 2018-06-04 DIAGNOSIS — G35 Multiple sclerosis: Secondary | ICD-10-CM

## 2018-06-04 MED ORDER — GADOBENATE DIMEGLUMINE 529 MG/ML IV SOLN
12.0000 mL | Freq: Once | INTRAVENOUS | Status: AC | PRN
  Administered 2018-06-04: 12 mL via INTRAVENOUS

## 2018-06-09 ENCOUNTER — Telehealth: Payer: Self-pay | Admitting: *Deleted

## 2018-06-09 ENCOUNTER — Telehealth: Payer: Self-pay | Admitting: Neurology

## 2018-06-09 NOTE — Telephone Encounter (Signed)
-----   Message from [name], MD sent at 06/05/2018  5:40 PM EDT ----- Please let Wonda Cerise know that the MRI of the brain and cervical spine did not show any new lesions

## 2018-06-09 NOTE — Telephone Encounter (Signed)
See result note/fim 

## 2018-06-09 NOTE — Telephone Encounter (Signed)
Patient calling to get MRI results. °

## 2018-06-09 NOTE — Telephone Encounter (Signed)
Spoke with [facility] and reviewed below MRI results.  She verbalized understanding of same/fim

## 2018-06-10 ENCOUNTER — Other Ambulatory Visit: Payer: Self-pay | Admitting: Neurology

## 2018-06-12 ENCOUNTER — Encounter: Payer: Self-pay | Admitting: Neurology

## 2018-06-17 ENCOUNTER — Telehealth: Payer: Self-pay | Admitting: Neurology

## 2018-06-17 MED ORDER — ESCITALOPRAM OXALATE 20 MG PO TABS: 20.0000 mg | ORAL_TABLET | Freq: Every day | ORAL | 3 refills | Status: DC

## 2018-06-17 NOTE — Telephone Encounter (Signed)
VM not set up/fim 

## 2018-06-17 NOTE — Telephone Encounter (Signed)
Spoke with [name].  He sts. Antigone is having more anxiety, now related to recent death of Marielis's god-dtr., who passed away in their home.  She is currently taking Diazepam 5mg  qhs for spasticity related to MS, and anxiety.  Per RAS, ok to take an additional 1/2 to 1 tablet during the day for anxiety, and also add Lexapro 20mg  daily.  [name] is agreeable. Rx. escribed to [facility]/fim

## 2018-06-17 NOTE — Telephone Encounter (Signed)
Patient calling to get something called in for anxiety. She does have Valium and wants something not as strong. She was very upset and would not go into detail. [facility] pharmacy is [facility] [address] [place]. Please call and discuss.

## 2018-06-17 NOTE — Addendum Note (Signed)
Addended by: Candis Schatz I on: 06/17/2018 11:44 AM   Modules accepted: Orders

## 2018-06-19 NOTE — Telephone Encounter (Signed)
Pt has called re: the funeral of her God Daughter, she would like RN Faith to call her back to say if it is suggested that she go or not go to the funeral.  Please call

## 2018-06-19 NOTE — Telephone Encounter (Signed)
Spoke with [employer].  She is concerned about her immune system since she just had Lemtrada infuisons in April.  After discussing the dynamics of the crowd that would be attending, we decided it would best for her not to attend, due to increased risk of infection/fim

## 2018-06-24 ENCOUNTER — Ambulatory Visit: Payer: Managed Care, Other (non HMO) | Admitting: Neurology

## 2018-06-25 ENCOUNTER — Other Ambulatory Visit: Payer: Self-pay | Admitting: Neurology

## 2018-07-03 ENCOUNTER — Telehealth: Payer: Self-pay | Admitting: Neurology

## 2018-07-03 MED ORDER — VALACYCLOVIR HCL 500 MG PO TABS: 500.0000 mg | ORAL_TABLET | Freq: Two times a day (BID) | ORAL | 1 refills | Status: DC

## 2018-07-03 MED ORDER — AMANTADINE HCL 100 MG PO CAPS: ORAL_CAPSULE | ORAL | 1 refills | Status: DC

## 2018-07-03 NOTE — Telephone Encounter (Signed)
Pt called stating she has taken her last dose for the antiviral lemtrada pt wanting to make sure she is down with the medication and doesn't need to continue. Also requesting a call to discuss coming off of escitalopram (LEXAPRO) 20 MG tablet please call to advise

## 2018-07-03 NOTE — Telephone Encounter (Signed)
I spoke to pt and relayed that due to her low lymphocyte count low from 06-12-18 testing will continue with valtrex 500mg  po bid for 2 more months.  Amantidine she stated was helping some (gentler then other meds she has tried for fatigue) so will continue on this.  I relayed that she can finish lexapro then stop.  She verbalized understanding.  Prescriptions called in.

## 2018-07-03 NOTE — Telephone Encounter (Signed)
Pt has finished the amantidine 100mg  po BID (60 tabs) from 06-10-18 (lemtrada pt) should she continue this?     Was given lexapro 20mg  daily  for anxiety starting 06-17-18. She would like to come off.

## 2018-07-03 NOTE — Telephone Encounter (Signed)
Ok to stop the Lexapro if she wants  The lymphocyte count was still low in July so we can do 2 more months of the anti-viral

## 2018-07-03 NOTE — Telephone Encounter (Signed)
Lets do 2 more months of Valtrex as the lymphocyte counts were still low when tested last month  The amantadine can help fatigue in MS ...  If it is helping she can stay on long term.  If not she could stop it

## 2018-07-03 NOTE — Telephone Encounter (Addendum)
I spoke to pt and she states she is on the valcyclovir 500mg  po bid since last yr.  She is taking the amantidine too?  I will call her pharmacy to clarify/confirm.  I told her that lexapro can be stopped as she only on for 2 wks, (I relayed that she can take every other day and then stop with last 14 tabs).  Some confusion as pt told me she was on Valcyclovir 500mg  po bid (no on her med list) and amantadine 100mg  po bid.  I told her I would call pharmacy.  I called and spoke to Summer at [facility],  [phone].  Valcyclovir she is taking since 03-31-18 and her last fill was 05-16-18.  Amantadine since 09/2017 and last fill 06-17-18.  Please advise.

## 2018-07-03 NOTE — Addendum Note (Signed)
Addended by: Guy Begin on: 07/03/2018 05:41 PM   Modules accepted: Orders

## 2018-07-14 ENCOUNTER — Other Ambulatory Visit: Payer: Self-pay | Admitting: Neurology

## 2018-07-14 NOTE — Telephone Encounter (Signed)
Refill for oxybutyin 15 mg refilled to [facility] in [place]. Refill for Amantadine refused due to electronic RX being sent on 07/03/2018. MB RN

## 2018-07-16 ENCOUNTER — Encounter: Payer: Self-pay | Admitting: Neurology

## 2018-07-25 ENCOUNTER — Telehealth: Payer: Self-pay | Admitting: Neurology

## 2018-07-25 NOTE — Telephone Encounter (Signed)
Spoke to patient and provided her with updated Lemtrada labs from her recent draw on 07/16/18.  Both numbers are coming up.

## 2018-07-25 NOTE — Telephone Encounter (Signed)
Pt is asking or a call back to know status of her lymphocyte count  And her CD4 levels.  Please call

## 2018-07-25 NOTE — Telephone Encounter (Signed)
Error

## 2018-08-14 ENCOUNTER — Encounter: Payer: Self-pay | Admitting: Neurology

## 2018-08-16 ENCOUNTER — Other Ambulatory Visit: Payer: Self-pay | Admitting: Neurology

## 2018-08-26 ENCOUNTER — Telehealth: Payer: Self-pay | Admitting: Neurology

## 2018-08-26 NOTE — Telephone Encounter (Signed)
Spoke with [facility] and reviewed Sept. Lemtrada labs/fim

## 2018-08-26 NOTE — Telephone Encounter (Signed)
Pt requesting a call to go over lab results. Please advise

## 2018-09-15 ENCOUNTER — Other Ambulatory Visit: Payer: Self-pay | Admitting: Neurology

## 2018-09-16 ENCOUNTER — Encounter: Payer: Self-pay | Admitting: Neurology

## 2018-09-23 NOTE — Telephone Encounter (Signed)
Pts called stating she has finished the antiviral med valACYclovir (VALTREX) 500 MG tablet needing to know if she is suppose to refill please advise

## 2018-09-23 NOTE — Telephone Encounter (Signed)
I spoke with Dr. Epimenio Foot. He states based off of recent labs, she does not need to continue Valtrex. I will call her.

## 2018-09-23 NOTE — Telephone Encounter (Signed)
Called, spoke with pt. Advised she not not need to continue valtrex. She verbalized understanding.

## 2018-10-14 ENCOUNTER — Encounter: Payer: Self-pay | Admitting: Neurology

## 2018-10-29 ENCOUNTER — Other Ambulatory Visit: Payer: Self-pay | Admitting: Neurology

## 2018-11-04 ENCOUNTER — Encounter: Payer: Self-pay | Admitting: *Deleted

## 2018-11-04 ENCOUNTER — Telehealth: Payer: Self-pay | Admitting: Neurology

## 2018-11-04 NOTE — Telephone Encounter (Signed)
Spoke with Dr. Epimenio Foot- ok to provide letter. Letter printed, waiting on MD signature

## 2018-11-04 NOTE — Telephone Encounter (Signed)
Patient requesting a letter to be excused from jury duty. I advised Dr. Epimenio Foot is out of the office and will send to his nurse. Please mail to patient.

## 2018-11-04 NOTE — Telephone Encounter (Signed)
Placed letter in mail for pt. 

## 2018-11-12 ENCOUNTER — Encounter: Payer: Self-pay | Admitting: Neurology

## 2018-11-20 ENCOUNTER — Other Ambulatory Visit: Payer: Self-pay | Admitting: Neurology

## 2018-12-15 ENCOUNTER — Telehealth: Payer: Self-pay | Admitting: Neurology

## 2018-12-15 ENCOUNTER — Encounter: Payer: Self-pay | Admitting: Neurology

## 2018-12-15 NOTE — Telephone Encounter (Signed)
Patient needing to know date when she was diagnosed with MS. Please call

## 2018-12-15 NOTE — Telephone Encounter (Signed)
I reviewed Dr. Bonnita HollowSater's office notes from his initial visit with her in 2016. He documented that she was dx with MS at age 48

## 2018-12-15 NOTE — Telephone Encounter (Signed)
I called patient back and relayed message below. She verbalized understanding.

## 2019-01-06 ENCOUNTER — Other Ambulatory Visit: Payer: Self-pay | Admitting: Neurology

## 2019-01-06 ENCOUNTER — Telehealth: Payer: Self-pay | Admitting: Neurology

## 2019-01-06 MED ORDER — AMANTADINE HCL 100 MG PO CAPS: ORAL_CAPSULE | ORAL | 1 refills | Status: DC

## 2019-01-06 NOTE — Telephone Encounter (Signed)
Pt called regarding refill for amantadine (SYMMETREL) 100 MG capsule being denied. She made an appt for 3/26. Can she be seen sooner or can medication be filled now?? Please call to advise

## 2019-01-06 NOTE — Telephone Encounter (Signed)
Clarified with Dr. Epimenio Foot- ok to refill amantadine. I e-scribed refill to pharmacy.

## 2019-01-06 NOTE — Telephone Encounter (Signed)
Called pt back. Advised she will need to keep f/u 02/26/19 for future refills. We can send refills to [facility] for her until appt. She verbalized understanding and appreciation.  After reviewing her chart again after speaking with pt, I will clarify with Dr. Epimenio Foot to see if she should continue taking this medication

## 2019-01-14 ENCOUNTER — Encounter: Payer: Self-pay | Admitting: Neurology

## 2019-02-12 ENCOUNTER — Encounter: Payer: Self-pay | Admitting: Neurology

## 2019-02-23 ENCOUNTER — Other Ambulatory Visit: Payer: Self-pay | Admitting: Neurology

## 2019-02-26 ENCOUNTER — Ambulatory Visit: Payer: Managed Care, Other (non HMO) | Admitting: Neurology

## 2019-02-28 ENCOUNTER — Other Ambulatory Visit: Payer: Self-pay | Admitting: Neurology

## 2019-03-13 ENCOUNTER — Encounter: Payer: Self-pay | Admitting: Neurology

## 2019-03-16 ENCOUNTER — Other Ambulatory Visit: Payer: Self-pay | Admitting: Neurology

## 2019-03-24 ENCOUNTER — Telehealth: Payer: Self-pay | Admitting: *Deleted

## 2019-03-24 NOTE — Telephone Encounter (Signed)
I called pt. Updated medication list/pharmacy/allergies on file for virtual visit on 03/26/19

## 2019-03-26 ENCOUNTER — Other Ambulatory Visit: Payer: Self-pay

## 2019-03-26 ENCOUNTER — Ambulatory Visit (INDEPENDENT_AMBULATORY_CARE_PROVIDER_SITE_OTHER): Payer: Medicare Other | Admitting: Neurology

## 2019-03-26 ENCOUNTER — Encounter: Payer: Self-pay | Admitting: Neurology

## 2019-03-26 DIAGNOSIS — R5383 Other fatigue: Secondary | ICD-10-CM | POA: Diagnosis not present

## 2019-03-26 DIAGNOSIS — G35 Multiple sclerosis: Secondary | ICD-10-CM | POA: Diagnosis not present

## 2019-03-26 DIAGNOSIS — R4189 Other symptoms and signs involving cognitive functions and awareness: Secondary | ICD-10-CM

## 2019-03-26 DIAGNOSIS — M5416 Radiculopathy, lumbar region: Secondary | ICD-10-CM

## 2019-03-26 DIAGNOSIS — R200 Anesthesia of skin: Secondary | ICD-10-CM

## 2019-03-26 DIAGNOSIS — M47817 Spondylosis without myelopathy or radiculopathy, lumbosacral region: Secondary | ICD-10-CM

## 2019-03-26 DIAGNOSIS — R269 Unspecified abnormalities of gait and mobility: Secondary | ICD-10-CM | POA: Diagnosis not present

## 2019-03-26 DIAGNOSIS — M5417 Radiculopathy, lumbosacral region: Secondary | ICD-10-CM

## 2019-03-26 MED ORDER — GABAPENTIN 300 MG PO CAPS: 300.0000 mg | ORAL_CAPSULE | Freq: Three times a day (TID) | ORAL | 11 refills | Status: DC

## 2019-03-26 NOTE — Progress Notes (Signed)
[facility]  PATIENT: Katherine Cameron DOB: [birth year]  REFERRING CLINICIAN: Hedgecock, PA-C HISTORY FROM: Patient with input form husband  REASON FOR VISIT: MS and spastic gait   HISTORICAL  CHIEF COMPLAINT:  Chief Complaint  Patient presents with  . Multiple Sclerosis    had second course of Lemtrada in 2019    HISTORY OF PRESENT ILLNESS:  Katherine Cameron is a 48 year old woman with MS since age 16.    Update 03/26/2019: Virtual Visit via Video Note I connected with Katherine Cameron on 03/29/19 at  4:00 PM EDT by a video enabled telemedicine application and verified that I am speaking with the correct person.  I discussed the limitations of evaluation and management by telemedicine and the availability of in person appointments. The patient expressed understanding and agreed to proceed.  History of Present Illness: She completed her second year of Lemtrada April 2019.  She can use a walker for about 40 feet.   She is noting more weakness in the right leg.   The left leg is not as weak as the left.   Arms are not weak but right hand fine motor skills are poor.    She has burning in both feet.   Bladder and bowel are doing much better on the oxybutynin.   She still has some urinary frequency.    Vision is about the same.   No diplopia.   She used to have migraine aura but not recently.    Her fatigue is much better on Lemtrada than on other medication.   If she is going to have a busier day, she takes amantadine.    She sleeps very well at night if she takes melatonin getting 8 hours. She will take a valium on rare nights that she   She feels refreshed.   She feels her memory is not as good as it was a couple years ago.     She has had LBP a few years but it is worse.  She also has much more right leg pain and is noting more right leg weakness.     Observations/Objective:  She is a well-developed well-nourished woman in no acute distress.  The head is normocephalic and  atraumatic.  Sclera are anicteric.  Visible skin appears normal.  The neck has a good range of motion.  Pharynx and tongue have normal appearance.  She is alert and fully oriented with fluent speech and good attention, knowledge and memory.  Extraocular muscles are intact.  Facial strength is normal.  Palatal elevation and tongue protrusion are midline. She has mild right hand weakness with reduced RAM.   Has reduced right finger nose finger  Assessment and Plan: Multiple sclerosis (HCC) - Plan: MR BRAIN W WO CONTRAST, MR CERVICAL SPINE W WO CONTRAST  Lumbosacral radiculopathy - Plan: MR LUMBAR SPINE WO CONTRAST  Abnormal gait  Other fatigue  Disturbed cognition  Loss of feeling or sensation  Spondylosis of lumbosacral region, unspecified spinal osteoarthritis complication status  Lumbar radiculopathy   1.   We need to check an MRI of the brain and cervical spine to determine if she is having subclinical progression.  If present, we will need to consider a different DMT.  Continue Lemtrada REMS monitoring 2,.   She is having more LBP, right leg pain and right leg weakness.    We need to check MRI of the lumbar spine and consider ESI or neurosurgical referral based on results. 3.   Continue gabapentin  and other meds for MS symptoms 4.   We discussed Covid-19 and her h/o [place]. 5.  rtc 6 months.   Follow Up Instructions: I discussed the assessment and treatment plan with the patient. The patient was provided an opportunity to ask questions and all were answered. The patient agreed with the plan and demonstrated an understanding of the instructions.    The patient was advised to call back or seek an in-person evaluation if the symptoms worsen or if the condition fails to improve as anticipated.  I provided 25 minutes of non-face-to-face time during this encounter.  ____________________________ From prior visits Update 05/29/2018: She had her second course of Lemtrada the end of  April.     She was feeling fairly good before the second course of Lemtrada.    However, she has had more fatigue and weakness the past month.  The right leg has always been weaker but now she is noting both legs seem heavy (right is still worse).   She feels like they are weighted down.   She is only able to take a few steps with her Rollator now but was able to walk 40-50 feet in April.    She also notes more mental fog.   Her last MRI was 07/2017 and showed no new lesions.       In general she is weaker in the evenings.     She feels vision is a little worse but her last ophtho appt showed no changes and she has the same lens script.     Her bladder urgency is much better with oxybutynin.    Sleep is worse.  She wakes up and then can;t fall back asleep.    If she sleeps poorly she has a bad day.   Melatonin helps her fall asleep.  She takes valium if she wakes up.     She notes a mental fog.   Ritalin hekped but caused her heart to race.    Amantadine also causes a mild increase in heart rate.       Update 12/24/2017: She tolerated her Lemtrada infusions well. For a month she felt slightly worse but since then has felt much better and feels she is doing better than she has over the last several years. Specifically she is able to take some steps without using a cane or walker now. She still uses a scooter for longer distances.  Fatigue is still a problem but is doing better than it was last year. Additionally, mood is doing better. She has not had any new symptoms since the last visit.  She has felt an internal tremor (not manifested by actual tremor) x several years that seems worse.  It is in the arms and legs.   She has tachycardia today and has had off/on.   She had a Holter or other monitor many years ago and was briefly on beta blockers.     From 06/25/2017: MS:    She had her Lemtrada infusions in early April 2018. She tolerated the infusions fairly well and feels it has been mild benefit. Before  Julaine Hua she was on Tecfidera.    She also took Biotin 300 mg and tolerates it well.   In September 2017, she had worsening leg function consistent with an exacerbation. However, MRI of the brain and cervical spine 09/10/2016 did not show any new lesions.   Gait/strength/sensation:   Gait is better since her [place].   There is still a lot of  fluctuations but there are many more good days.    She can take a few steps with a walker some days.   She relies on the scooter around the house. She can transfer by herself. Several months ago, she was able to walk with a walker or holding onto furniture around the house.   Bladder function:   She has a urinary hesitancy more than urinary frequency and urgency.  She has incontinence out of the house but not in the house.   She wears Depends out of the house. She has not seen urology.   Oxybutynin hlpes the bladder some.   Vision:    She has decreased vision out of the left from old ON but it is stable  Fatigue/sleep:   Fatigue is bothersome some days.   She feels it is a little better with CBD oil.    Ritalin 20-40 mg daily has helped..  Although this helps the fatigue, it causes heart palpitations at times.   Sleep is much better with tizanidine and valium and she is going to try to cut back some on the tizanidine  Mood/cognition:  Mood has done better this year.She stopped the Lexapro and feels she gets some benefit form CBD oil..  She denies current anxiety.  She has noted mild cognitive issues with reduced focus, STM and she has trouble with verbal fluency.  She notes difficulty doing some cognitive tasks.   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   Ritalin has helped her focus and attention but not to baseline.   She takes as needed.    MS History:  She presented with right optic neuritis. She went to an ophthalmologist and then was  referred to neurology where she had an MRI of the brain performed. The MRI was consistent with MS. She did not need to have a lumbar puncture. This was before FDA approved medications and she had several courses of IV steroids. Around 1994, she started Avonex after a large exacerbation around 2008, she went on Tysabri. She was more stable on Tysabri. Unfortunately, she had a a high titer JCV antibody. She was on Tysabri for about 3 years and then was switched to Tecfidera. She has been on Tecfidera for about 3 years. During the transition from Tysabri to [place], she had an especially severe exacerbation with significant worsening of her gait. A new focus was noted in the cervical spine.      REVIEW OF SYSTEMS:  Constitutional: No fevers, chills, sweats, or change in appetite.  She has insomnia Eyes: No visual changes, double vision, eye pain Ear, nose and throat: No hearing loss, ear pain, nasal congestion, sore throat Cardiovascular: No chest pain, palpitations Respiratory:  No shortness of breath at rest or with exertion.   No wheezes GastrointestinaI: No nausea, vomiting, diarrhea, abdominal pain, fecal incontinence Genitourinary:  No dysuria, urinary retention or frequency.  No nocturia. Musculoskeletal:  No neck pain, back pain.  She has left > right hip pain Integumentary: No rash, pruritus, skin lesions Neurological: as above Psychiatric: No depression at this time.  No anxiety Endocrine: No palpitations, diaphoresis, change in appetite, change in weigh or increased thirst Hematologic/Lymphatic:  No anemia, purpura, petechiae. Allergic/Immunologic: No itchy/runny eyes, nasal congestion, recent allergic reactions, rashes  ALLERGIES: No Known Allergies  HOME MEDICATIONS: Outpatient Medications Prior to Visit  Medication Sig Dispense Refill  . Alemtuzumab (LEMTRADA) 12 MG/1.2ML  SOLN Inject 12 mg into the vein.    Allen Kell Lipoic Acid 200 MG CAPS Take 2 tablets daily 60 capsule 11  .  amantadine (SYMMETREL) 100 MG capsule TAKE 1 CAPSULE(100 MG) BY MOUTH TWICE DAILY 60 capsule 0  . baclofen (LIORESAL) 10 MG tablet Take 1 tablet (10 mg total) by mouth 3 (three) times daily as needed for muscle spasms. 90 each 11  . Cholecalciferol (VITAMIN D3) 5000 UNITS CAPS Take 1 capsule (5,000 Units total) by mouth daily. 90 capsule 3  . co-enzyme Q-10 50 MG capsule Take 50 mg by mouth daily.    . diazepam (VALIUM) 5 MG tablet TAKE 1 TABLET(5 MG) BY MOUTH AT BEDTIME AS NEEDED FOR ANXIETY 30 tablet 2  . EVENING PRIMROSE OIL PO Take by mouth.    Marland Kitchen ibuprofen (ADVIL,MOTRIN) 600 MG tablet Take 1 tablet (600 mg total) by mouth every 6 (six) hours as needed. 30 tablet 0  . imipramine (TOFRANIL) 25 MG tablet TAKE 1 TO 2 TABLETS(25 TO 50 MG) BY MOUTH AT BEDTIME 180 tablet 0  . Melatonin 10 MG TABS Take 10 mg by mouth at bedtime.    Marland Kitchen oxybutynin (DITROPAN XL) 15 MG 24 hr tablet TAKE 1 TABLET BY MOUTH EVERY NIGHT AT BEDTIME 90 tablet 0  . UNABLE TO FIND Essential Amino Acids     No facility-administered medications prior to visit.   also on Tecfidera 240 mg bid, imipramine 25 mg at night and Ritalin 20 mg once or twice a day  PAST MEDICAL HISTORY: Past Medical History:  Diagnosis Date  . Movement disorder   . Multiple sclerosis (HCC)   . Neuropathy   . Pneumothorax, spontaneous, tension    Left x 2  . Vision abnormalities     PAST SURGICAL HISTORY: Past Surgical History:  Procedure Laterality Date  . PLEURAL SCARIFICATION     collasped lung     FAMILY HISTORY: Family History  Problem Relation Age of Onset  . Hypertension Mother   . Hyperlipidemia Mother   . Colon cancer Mother   . COPD Father   . Multiple sclerosis Sister     SOCIAL HISTORY:  Social History   Socioeconomic History  . Marital status: Married    Spouse name: Not on file  . Number of children: Not on file  . Years of education: Not on file  . Highest education level: Not on file  Occupational History  .  Not on file  Social Needs  . Financial resource strain: Not on file  . Food insecurity:    Worry: Not on file    Inability: Not on file  . Transportation needs:    Medical: Not on file    Non-medical: Not on file  Tobacco Use  . Smoking status: Former Smoker    Types: Cigarettes  . Smokeless tobacco: Former Neurosurgeon    Quit date: 12/03/2014  Substance and Sexual Activity  . Alcohol use: No    Alcohol/week: 0.0 standard drinks  . Drug use: No  . Sexual activity: Yes    Birth control/protection: Pill  Lifestyle  . Physical activity:    Days per week: Not on file    Minutes per session: Not on file  . Stress: Not on file  Relationships  . Social connections:    Talks on phone: Not on file    Gets together: Not on file    Attends religious service: Not on file    Active member of club or  organization: Not on file    Attends meetings of clubs or organizations: Not on file    Relationship status: Not on file  . Intimate partner violence:    Fear of current or ex partner: Not on file    Emotionally abused: Not on file    Physically abused: Not on file    Forced sexual activity: Not on file  Other Topics Concern  . Not on file  Social History Narrative  . Not on file     PHYSICAL EXAM  There were no vitals filed for this visit. Pulse rechecked and was 116.  There is no height or weight on file to calculate BMI.   General: The patient is well-developed and well-nourished and in no acute distress   Neurologic Exam  Mental status: The patient is alert and oriented x 3 at the time of the examination. The patient has apparent normal recent and remote memory, with an apparently normal attention span and concentration ability.   Speech is normal.  Cranial nerves: Extraocular movements are full.  Facial strength and sensation is normal.. No dysarthria is noted.  The tongue is midline, and the patient has symmetric elevation of the soft palate. No obvious hearing deficits are  noted.  Motor:  Muscle bulk is normal and tone is increased in both legs,  right > left.   There is minimal right arm increased tone. Strength is  5 / 5 in arms and poor in  Legs 2+ to 3/5 on right and 4-/5 on the left  Sensory: Sensory testing is intact to touch and vibration sensation in all 4 extremities.  Coordination: Cerebellar testing reveals good finger-nose-finger on the left but mildly reduced rapid alternating movements and FTN in right hand.   Heel-to-shin was reduced on the left and she cannot do the right  Gait and station: Station is unstable.   With unilateral support, she can walk about 10 feet and then the right leg starts to become too weak and she needs bilateral support.  Reflexes: Deep tendon reflexes are increased, right > left.  Spread at the knees but no clonus    DIAGNOSTIC DATA (LABS, IMAGING, TESTING) - I reviewed patient records, labs, notes, testing and imaging myself where available.  Lab Results  Component Value Date   WBC 7.4 05/29/2018   HGB 13.4 05/29/2018   HCT 40.9 05/29/2018   MCV 91 05/29/2018   PLT 331 05/29/2018       ASSESSMENT AND PLAN  Multiple sclerosis (HCC) - Plan: MR BRAIN W WO CONTRAST, MR CERVICAL SPINE W WO CONTRAST  Lumbosacral radiculopathy - Plan: MR LUMBAR SPINE WO CONTRAST  Abnormal gait  Other fatigue  Disturbed cognition  Loss of feeling or sensation  Spondylosis of lumbosacral region, unspecified spinal osteoarthritis complication status  Lumbar radiculopathy   Juquan Reznick A. Epimenio Foot, MD, PhD 03/29/2019, 8:23 PM Certified in Neurology, Clinical Neurophysiology, Sleep Medicine, Pain Medicine and Neuroimaging  [facility] [address] [place], [place] [address] [phone]

## 2019-03-29 ENCOUNTER — Encounter: Payer: Self-pay | Admitting: Neurology

## 2019-03-29 DIAGNOSIS — M5416 Radiculopathy, lumbar region: Secondary | ICD-10-CM | POA: Insufficient documentation

## 2019-03-30 ENCOUNTER — Telehealth: Payer: Self-pay | Admitting: Neurology

## 2019-03-30 ENCOUNTER — Encounter: Payer: Self-pay | Admitting: Family Medicine

## 2019-03-30 NOTE — Telephone Encounter (Signed)
[employer] Berkley Harvey: [id number] (exp. 03/30/19 to 09/25/19) order sent to GI. They will reach out to the pt to schedule.

## 2019-04-04 ENCOUNTER — Other Ambulatory Visit: Payer: Self-pay | Admitting: Neurology

## 2019-04-06 ENCOUNTER — Telehealth: Payer: Self-pay | Admitting: *Deleted

## 2019-04-06 NOTE — Telephone Encounter (Signed)
Gave completed/signed [employer] disability form back to medical records to process for pt. 

## 2019-04-17 ENCOUNTER — Ambulatory Visit
Admission: RE | Admit: 2019-04-17 | Discharge: 2019-04-17 | Disposition: A | Discharge status: Home / Self Care | Payer: Medicare Other | Source: Ambulatory Visit | Attending: Neurology | Admitting: Neurology

## 2019-04-17 ENCOUNTER — Other Ambulatory Visit: Payer: Self-pay

## 2019-04-17 DIAGNOSIS — M5417 Radiculopathy, lumbosacral region: Secondary | ICD-10-CM

## 2019-04-17 DIAGNOSIS — G35 Multiple sclerosis: Secondary | ICD-10-CM | POA: Diagnosis not present

## 2019-04-17 MED ORDER — GADOBENATE DIMEGLUMINE 529 MG/ML IV SOLN
10.0000 mL | Freq: Once | INTRAVENOUS | Status: AC | PRN
  Administered 2019-04-17: 10 mL via INTRAVENOUS

## 2019-04-20 ENCOUNTER — Telehealth: Payer: Self-pay | Admitting: Neurology

## 2019-04-20 MED ORDER — METHYLPREDNISOLONE 4 MG PO TABS: ORAL_TABLET | ORAL | 0 refills | Status: DC

## 2019-04-20 NOTE — Telephone Encounter (Signed)
I discussed MRI's  Brain shows no new lesions, slightly more atrophy.. Cerv spine shows no new lesion or other changes   MRI lumbar shows borderline spinal stenosis with advanced facet hypertrophy at L4L5 and L5S1, progressed compared to 2013.  We discussed MBB/RFA if steroid pack and NSAIDs do not help enough.

## 2019-04-22 ENCOUNTER — Telehealth: Payer: Self-pay

## 2019-04-22 NOTE — Telephone Encounter (Signed)
Unable to get in contact with the patient to schedule their 6 month follow up with Dr. Epimenio Foot. I left a voicemail asking the patient to return my call. Office number was provided.  If patient calls back please schedule their follow up appt.

## 2019-05-21 ENCOUNTER — Other Ambulatory Visit: Payer: Self-pay | Admitting: Neurology

## 2019-05-25 ENCOUNTER — Telehealth: Payer: Self-pay | Admitting: Neurology

## 2019-05-25 DIAGNOSIS — R400 Somnolence: Secondary | ICD-10-CM

## 2019-05-25 DIAGNOSIS — R0683 Snoring: Secondary | ICD-10-CM

## 2019-05-25 NOTE — Telephone Encounter (Signed)
Pt called wanting to know if a CPAP would help her with her MS. Please advise.

## 2019-05-25 NOTE — Telephone Encounter (Signed)
I reached out to the Katherine Cameron. She is interested in pursing a CPAP to see if it would help with MS progression. I reviewed with the Katherine Cameron about CPAP indications like OSA, trouble breathing during rest or excessive snoring. Katherine Cameron states she has never been tested for OSA and did not indicate trouble breathing while sleeping but did state she snores.   Katherine Cameron was advised Dr. Felecia Shelling is currently out of the office and will return on 06/01/19. Katherine Cameron is agreeable to waiting until Dr. Felecia Shelling returns for this message to be further reviewed.

## 2019-05-27 NOTE — Telephone Encounter (Signed)
Order has been placed and pt notified of MD instructions. Pt was agreeable with pursing the recommended tests.

## 2019-05-27 NOTE — Addendum Note (Signed)
Addended by: Verlin Grills T on: 05/27/2019 02:35 PM   Modules accepted: Orders

## 2019-05-27 NOTE — Telephone Encounter (Signed)
Please order a PSG for snoring and sleepiness (prefer in lab PSG but if insurance does not cover, can do Home Sleep study).   I will let her know the results

## 2019-06-10 ENCOUNTER — Other Ambulatory Visit: Payer: Self-pay | Admitting: Neurology

## 2019-06-18 ENCOUNTER — Telehealth: Payer: Self-pay | Admitting: *Deleted

## 2019-06-18 NOTE — Telephone Encounter (Signed)
Called and spoke with pt. Advised I spoke with Junie Panning who helps coordinate Lemtrada patient's for [place]. The home lab draw program unexpectedly shut down on 06/05/19.  While they are looking for a new company to use for the home draws, advised we will send new orders to local [facility] so labs are still covered.  Our office is not a freestanding [facility]. I looked online and the closest her address is [address] [place], [place] [address].  Their phone number is: [phone]. Faxed orders that will be good for 6 months. Recommended she call ahead to schedule lab appt. She last had them on 05/14/2019 Advised she is due for labs and to schedule at her soonest convenience. She verbalized understanding and appreciation for call.

## 2019-06-19 ENCOUNTER — Encounter: Payer: Self-pay | Admitting: Neurology

## 2019-07-03 ENCOUNTER — Ambulatory Visit (INDEPENDENT_AMBULATORY_CARE_PROVIDER_SITE_OTHER): Payer: Medicare Other | Admitting: Neurology

## 2019-07-03 ENCOUNTER — Other Ambulatory Visit: Payer: Self-pay

## 2019-07-03 DIAGNOSIS — G4700 Insomnia, unspecified: Secondary | ICD-10-CM

## 2019-07-03 DIAGNOSIS — R0683 Snoring: Secondary | ICD-10-CM

## 2019-07-03 DIAGNOSIS — G4761 Periodic limb movement disorder: Secondary | ICD-10-CM

## 2019-07-03 DIAGNOSIS — G4733 Obstructive sleep apnea (adult) (pediatric): Secondary | ICD-10-CM

## 2019-07-03 DIAGNOSIS — R400 Somnolence: Secondary | ICD-10-CM

## 2019-07-06 DIAGNOSIS — G4761 Periodic limb movement disorder: Secondary | ICD-10-CM | POA: Insufficient documentation

## 2019-07-06 NOTE — Progress Notes (Signed)
PATIENT'S NAME:  Katherine Cameron, Katherine Cameron DOB:      1971-10-31      MR#:    [id number]     DATE OF RECORDING: 07/03/2019 REFERRING M.D.:  Jamal Collin, PA-C Study Performed:   Baseline Polysomnogram HISTORY:  Multiple Sclerosis, Movement disorder, Neuropathy, Pneumothorax, Vision abnormalities  The patient endorsed the Epworth Sleepiness Scale at 8 points.    The patient's weight 118 pounds with a height of 65 (inches), resulting in a BMI of 19.8 kg/m2.  The patient's neck circumference measured 12.8 inches.  CURRENT MEDICATIONS: Julaine Hua, Alpha lipois acid, Symmetrel, Lioresal, Vitamin D3, CoQ10, Valium, Evening primrose oil, Tofranil, Melatonin, Ditropan XL,   PROCEDURE:  This is a multichannel digital polysomnogram utilizing the Somnostar 11.2 system.  Electrodes and sensors were applied and monitored per AASM Specifications.   EEG, EOG, Chin and Limb EMG, were sampled at 200 Hz.  ECG, Snore and Nasal Pressure, Thermal Airflow, Respiratory Effort, CPAP Flow and Pressure, Oximetry was sampled at 50 Hz. Digital video and audio were recorded.      BASELINE STUDY  Lights Out was at 22:10 and Lights On at 05:03.  Total recording time (TRT) was 413.5 minutes, with a total sleep time (TST) of 141.5 minutes.   The patient's sleep latency was 34 minutes.  REM latency was 0 minutes.  The sleep efficiency was 34.2 %.     SLEEP ARCHITECTURE: WASO (Wake after sleep onset) was 175 minutes.  There were 23.5 minutes in Stage N1, 116.5 minutes Stage N2, 1.5 minutes Stage N3 and 0 minutes in Stage REM.  The percentage of Stage N1 was 16.6%, Stage N2 was 82.3%, Stage N3 was 1.1% and Stage R (REM sleep) was 0%.   RESPIRATORY ANALYSIS:  There were a total of 0 respiratory events:  0 obstructive apneas, 0 central apneas and 0 mixed apneas with a total of 0 apneas and an apnea index (AI) of 0 /hour. There were 0 hypopneas with a hypopnea index of 0 /hour. The patient also had 0 respiratory event related arousals  (RERAs).      The total APNEA/HYPOPNEA INDEX (AHI) was 0 /hour and the total RESPIRATORY DISTURBANCE INDEX was 0. 0 /hour.  0 events occurred in REM sleep and 0 events in NREM. The REM AHI was 0 /hour, versus a non-REM AHI of 0. The patient spent 0 minutes of total sleep time in the supine position and 142 minutes in non-supine.. The supine AHI was 0.0 versus a non-supine AHI of 0.0.  OXYGEN SATURATION & C02:  The Wake baseline 02 saturation was 88%, with the lowest being 89%. Time spent below 89% saturation equaled 0 minutes.  PERIODIC LIMB MOVEMENTS:   The patient had a total of 136 Periodic Limb Movements.  The Periodic Limb Movement (PLM) index was 57.7 and the PLM Arousal index was 0/hour. The arousals were noted as: 4 were spontaneous, 0 were associated with PLMs, 0 were associated with respiratory events.  OTHER:   Audio and video analysis did not show any abnormal or unusual movements, behaviors, phonations or vocalizations.   The patient did not take bathroom breaks. Snoring was not noted. EKG showed normal sinus rhythm (NSR).   IMPRESSION:   Periodic Limb Movement Disorder (PLMD) with an index of 58/hour but no impact on sleep  There was no OSA noted  Poor sleep efficiency of only 34% due to delayed sleep onset and increased wakefulness after sleep onset   RECOMMENDATIONS:  Consider treatment of the PLMS with  a dopamine agonist or gabapentin  Follow up with Dr. Epimenio FootSater   I certify that I have reviewed the entire raw data recording prior to the issuance of this report in accordance with the Standards of Accreditation of the American Academy of Sleep Medicine (AASM)   Braylinn Gulden A. Epimenio FootSater, MD, PhD, FAAN Certified in Neurology, Clinical Neurophysiology, Sleep Medicine, Pain Medicine and Neuroimaging Director, [facility]  [facility] [address], [address] [place], [place] [address] [phone]    Demographics and Medical History           Name: Katherine Cameron, Katherine Cameron Age: 48 BMI: 19.8 Interp Physician: Despina Ariasichard Ariella Voit, MD  DOB: July 06, 1971 Ht-IN: 65 CM: 165 Referred By: Jamal CollinSuzanne Hedgecock, PA-C  Pt. Tag:  Wt-LB: 118 KG: 54 Tested By: Charlesetta IvoryBeau Handy RPSGT  Pt. #: [id number] Sex: Female Scored By: Charlesetta IvoryBeau Handy RPSGT  Bed Tag: [id number] Race: Caucasian    Sleep Summary    Sleep Time Statistics Minutes Hours    Time in Bed 413.5    6.9    Total Sleep Time 141.5    2.4    Total Sleep Time NREM 141.5    2.4    Total Sleep Time REM 0    0.0    Sleep Onset 34    0.6    Wake After Sleep Onset 175    2.9    Wake After Sleep Period 63    1.1    Latency Persistent Sleep 34    0.6    Sleep Efficiency 34.2 Percent    Lights out 22:10     Lights on 05:03    Sleep Disruption Events Count Index    Arousals 4 1.7    Awakenings 10 4.2    Arousals + Awakenings 14 5.9    REM Awakenings 0 0.0     Sleep Stage Statistics Wake N1 N2 N3 REM    Percent Stage to SPT 55.3  7.4  36.8  0.5  0.0  Percent   Sleep Period Time in Stage 175 23.5 116.5 1.5 0 Minutes   Latency to Stage  34 39 80 0 Minutes   Percent Stage to TST  16.6 82.3 1.1 0 Percent   EKG Summary          EKG Statistics         Heart Rate, Wake 94 BPM  TST Epochs in HR Interval 0 < 29   Heart Rate, Steady Sleep Avg 80 BPM   0 30-59   PAC Events 0 Count   124 60-79   PVC Events 0 Count   159 80-99   Bradycardia 0 Count   0 100-119   Tachycardia 0 Count   0 120-139        0 140-159    NREM REM   0 > 160   Shortest R-R .7 0       Longest R-R 0.8 0        Respiration Summary  Event Statistics Total  With Arousal  With Awakening    Count Index  Count Index  Count Index   Apneas, Total 0 0  0 0.0   0 0.0    Hypopneas, Total 0 0  0 0.0   0 0.0    Apnea + Hypopnea Index 0 0.0   0 0.0   0 0.0    Apneas, Supine 0 0     Apneas, Non Supine 0 0  Hypopneas, Supine 0 0     Hypopneas, Non Supine 0 0     % Sleep Apnea 0 Percent     % Sleep  Hypopnea 0 Percent    Oximetry Statistics       SpO2, Mean Wake 88 Percent     SpO2, Minimum 89 Percent     SpO2, Max 99 Percent     SpO2, Mean 98 Percent            Desaturation Index, REM 0.0  Index     Desaturation Index, NREM 0.0  Index     Desaturation Index, Total 0.0  Index             SpO2 Intervals > 89% 80-89% 70-79% 60-69% 50-59% 40-49% 30-39% < 30%  141.5 Percent Sleep Time 100 0 0 0 0 0 0 0  Body Position Statistics   Back Side L Side R Side Prone    Total Sleep Time   0 141.5 36 105.5 0 Minutes   Percent Time to TST   0.0  100.0  25.4  74.6  0.0  Percent   Number of Events   0 0.0 0 0 0 Count   Number of Apneas   0 0 0 0 0 Count   Number of Hypopneas   0 0 0 0 0 Count   Apnea Index   0.0  0.0  0.0  0.0  0.0  Index   Hypopnea Index   0.0  0.0  0.0  0.0  0.0  Index   Apnea + Hypopnea Index   0.0  0.0  0.0  0.0  0.0  Index  Respiration Events    Non REM, Pre Rx Statistics Non Supine  Supine    Central Mixed Obstr  Central Mixed Obstr   Apneas 0 0 0  0 0 0 Count  Apneas, Minimum SpO2 0 0 0  0 0 0 Percent     Hypopneas 0 0 0  0 0 0 Count  Hypopneas, Minimum SpO2 0 0 0  0 0 0 Percent     Apnea + Hypopneas Index 0.0 0.0 0.0  0.0 0.0 0.0 Index    REM, Pre Rx Statistics Non Supine  Supine    Central Mixed Obstr  Central Mixed Obstr   Apneas 0 0 0  0 0 0 Count  Apneas, Minimum SpO2 0 0 0  0 0 0 Percent     Hypopneas 0 0 0  0 0 0 Count  Hypopneas, Minimum SpO2 0 0 0  0 0 0 Percent     Apnea + Hypopnea Index 0.0 0.0 0.0  0.0 0.0 0.0 Index  Leg Movement Summary    PLM Non REM (Incl. Wake) REM Total    No Arousal Arousal Wake No Arousal Arousal Wake No Arousal Arousal Wake Total   Isolated 0 0 0 0 0 0 0 0 0 0    PLMS 135 0 1 0 0 0 135 0 1 136    Total 135 0 1 0 0 0 135 0 1 136   PLM Statistics PLMS Total     Count Index Count Index    PLM 136 57.7 136 57.7     PLM with Arousal 0 0 0 0.0     PLM, with Wake 1 .4 1 0.4     PLM, Arousal +  Wake 1 0.4 1 0.4     PLM, No Arousal 135 57.2  135 57.2     PLM,  Non REM 136 57.7  136 57.7     PLM, REM 0 0.0  0 0.0     Technician Comments: The Patient came into the Sleep Lab for a NPSG.  She took Valium and two Tizanidine prior to bed.  She still had difficulty maintaining sleep.  She did not snore and had no sleep disordered breathing.  She did have a run of PLMD, which was scored.  She had no trips to the restroom and was in NSR throughout the study.

## 2019-07-07 ENCOUNTER — Telehealth: Payer: Self-pay | Admitting: *Deleted

## 2019-07-07 MED ORDER — ROPINIROLE HCL 0.5 MG PO TABS: 0.5000 mg | ORAL_TABLET | Freq: Every day | ORAL | 5 refills | Status: DC

## 2019-07-07 NOTE — Telephone Encounter (Signed)
Called pt back. Relayed Dr. Garth Bigness message. She is agreeable to try ropinirole. She has never been on this before. I e-scribed to pharmacy. Asked her to call back if she has any further questions/concerns.

## 2019-07-07 NOTE — Telephone Encounter (Signed)
-----   Message from Britt Bottom, MD sent at 07/06/2019  6:27 PM EDT ----- Regarding: sleep study Please let her know that the sleep study did not show any sleep apnea.  She did have a lot of leg movements when she was asleep.  That sometimes kicks people from getting into a deeper sleep.  Gabapentin sometimes helps but she is already on that.  If she would like we could try ropinirole 0.5 mg at night to see if she has better quality sleep.

## 2019-07-07 NOTE — Telephone Encounter (Signed)
Pt called in and stated she slept only a little over and hour, she wants to know if that enough data to determine she has restless leg

## 2019-07-07 NOTE — Telephone Encounter (Signed)
Called, LVM for pt to call office. 

## 2019-07-07 NOTE — Telephone Encounter (Signed)
I called and spoke w/ pt. Relayed Dr. Garth Bigness message. She is agreeable to try ropinirole. She will call back if any further questions/concerns.

## 2019-07-07 NOTE — Telephone Encounter (Signed)
She actually had severe limb movements while asleep (periodic limb movements of sleep).  80% of people with PLMS also have restless leg syndrome.   The same medications can help both.   Since she has a problem with insomnia, its worth a try to go on a medication for the legs

## 2019-07-07 NOTE — Telephone Encounter (Signed)
Dr. Sater- please advise 

## 2019-07-22 ENCOUNTER — Other Ambulatory Visit: Payer: Self-pay | Admitting: Neurology

## 2019-07-29 ENCOUNTER — Telehealth: Payer: Self-pay | Admitting: Neurology

## 2019-07-29 DIAGNOSIS — R269 Unspecified abnormalities of gait and mobility: Secondary | ICD-10-CM

## 2019-07-29 DIAGNOSIS — G35 Multiple sclerosis: Secondary | ICD-10-CM

## 2019-07-29 NOTE — Addendum Note (Signed)
Addended by: Edison Pace, Trystin Terhune L on: 07/29/2019 10:30 AM   Modules accepted: Orders

## 2019-07-29 NOTE — Telephone Encounter (Signed)
Pt called stating that she would like to start PT. Please advise.

## 2019-07-29 NOTE — Telephone Encounter (Addendum)
Called pt back. She would like to go to PT in [place], [place]. Placed referral. Also reminded her she is past due for monthly [place] labs. Her lab labs were drawn 06/19/19. She should go to [facility] [place], [place] [address].  Their phone number is: [phone]. She will call to get this set up asap.

## 2019-07-31 ENCOUNTER — Encounter: Payer: Self-pay | Admitting: Neurology

## 2019-08-11 ENCOUNTER — Telehealth: Payer: Self-pay | Admitting: Neurology

## 2019-08-11 NOTE — Telephone Encounter (Signed)
I really cannot think of anything to help with bowel incontinence unfortunately.  We may have to have Dr. Felecia Shelling review this when he is back.  If it is diarrhea related, as needed use of Imodium can help but also runs the risk of constipation.

## 2019-08-11 NOTE — Telephone Encounter (Signed)
Called pt back. Has two degenerative discs and she is having a lot of pain associated with this that is constant. She has not see ortho or neurosurgeon for this. She will f/u with PCP about getting referral to be further evaluated.   She also mentioned she does not have regular/normal bowel movements. She is having bowel incontinence which she has had hx of. It has worsened now. She is wanting to know if there is anything that can be prescribed to help this. Advised Dr. Felecia Shelling off this week but I will send message to [facility] and call back tomorrow to let her know.

## 2019-08-11 NOTE — Telephone Encounter (Signed)
Pt is asking for a call to discuss her degenerative disc and fusion surgery & pt wants to know if sher can be given a pill for her bowel incontinence

## 2019-08-12 NOTE — Telephone Encounter (Signed)
I called pt back and relayed Dr. Guadelupe Sabin message. She verbalized understanding. Advised I will speak with Dr. Felecia Shelling when he returns and call back with recommendation from him.

## 2019-08-14 ENCOUNTER — Other Ambulatory Visit: Payer: Self-pay

## 2019-08-14 ENCOUNTER — Emergency Department (HOSPITAL_BASED_OUTPATIENT_CLINIC_OR_DEPARTMENT_OTHER)
Admission: EM | Admit: 2019-08-14 | Discharge: 2019-08-15 | Disposition: A | Discharge status: Home / Self Care | Payer: Medicare Other | Attending: Emergency Medicine | Admitting: Emergency Medicine

## 2019-08-14 ENCOUNTER — Encounter (HOSPITAL_BASED_OUTPATIENT_CLINIC_OR_DEPARTMENT_OTHER): Payer: Self-pay

## 2019-08-14 DIAGNOSIS — R152 Fecal urgency: Secondary | ICD-10-CM | POA: Insufficient documentation

## 2019-08-14 DIAGNOSIS — R159 Full incontinence of feces: Secondary | ICD-10-CM | POA: Diagnosis not present

## 2019-08-14 DIAGNOSIS — Z79899 Other long term (current) drug therapy: Secondary | ICD-10-CM | POA: Diagnosis not present

## 2019-08-14 DIAGNOSIS — Z87891 Personal history of nicotine dependence: Secondary | ICD-10-CM | POA: Insufficient documentation

## 2019-08-14 DIAGNOSIS — M545 Low back pain, unspecified: Secondary | ICD-10-CM

## 2019-08-14 LAB — CBC WITH DIFFERENTIAL/PLATELET
Abs Immature Granulocytes: 0.03 10*3/uL (ref 0.00–0.07)
Basophils Absolute: 0 10*3/uL (ref 0.0–0.1)
Basophils Relative: 1 %
Eosinophils Absolute: 0.1 10*3/uL (ref 0.0–0.5)
Eosinophils Relative: 2 %
HCT: 40.8 % (ref 36.0–46.0)
Hemoglobin: 13.5 g/dL (ref 12.0–15.0)
Immature Granulocytes: 0 %
Lymphocytes Relative: 23 %
Lymphs Abs: 1.7 10*3/uL (ref 0.7–4.0)
MCH: 29.6 pg (ref 26.0–34.0)
MCHC: 33.1 g/dL (ref 30.0–36.0)
MCV: 89.5 fL (ref 80.0–100.0)
Monocytes Absolute: 0.5 10*3/uL (ref 0.1–1.0)
Monocytes Relative: 7 %
Neutro Abs: 4.9 10*3/uL (ref 1.7–7.7)
Neutrophils Relative %: 67 %
Platelets: 278 10*3/uL (ref 150–400)
RBC: 4.56 MIL/uL (ref 3.87–5.11)
RDW: 12.9 % (ref 11.5–15.5)
WBC: 7.3 10*3/uL (ref 4.0–10.5)
nRBC: 0 % (ref 0.0–0.2)

## 2019-08-14 MED ORDER — LORAZEPAM 1 MG PO TABS
1.0000 mg | ORAL_TABLET | Freq: Once | ORAL | Status: AC
  Administered 2019-08-14: 1 mg via ORAL
  Filled 2019-08-14: qty 1

## 2019-08-14 MED ORDER — OXYCODONE-ACETAMINOPHEN 5-325 MG PO TABS
1.0000 | ORAL_TABLET | Freq: Once | ORAL | Status: AC
  Administered 2019-08-15: 1 via ORAL
  Filled 2019-08-14: qty 1

## 2019-08-14 NOTE — ED Provider Notes (Signed)
[facility] EMERGENCY DEPARTMENT Provider Note   CSN: [id number] Arrival date & time: 08/14/19  1620     History   Chief Complaint Chief Complaint  Patient presents with  . Encopresis    HPI Katherine Cameron is a 48 y.o. female.     HPI   Hx of MS, now with back pain, stool incontinence for 2 weeks  Hx of constipation in the past Hx of diarrhea over the last 2 weeks, lactose intolerant, has been every day for 2 weeks, stopped having cream cheese bagel but still continued to have diarrhea every day for 2 weeks. Having urgency, needs to go. Not always liquidy but is soft.  Reports she will feel the need to go but then has no ability to stop herself from going prior to getting to the bathroom.  Has chronic urinary symptoms from MS but feels ok on oxybutinin now, no urinary hesitancy or incontinence Took immodium this AM Weakness and tingling in legs is the same, not changed.  Lower back pain for 2 weeks, has progressing over the last 2 weeks  Sent for Cauda Equina evaluation by PT  Past Medical History:  Diagnosis Date  . Movement disorder   . Multiple sclerosis (HCC)   . Neuropathy   . Pneumothorax, spontaneous, tension    Left x 2  . Vision abnormalities     Patient Active Problem List   Diagnosis Date Noted  . Periodic limb movement 07/06/2019  . Lumbar radiculopathy 03/29/2019  . Low TSH level 12/17/2016  . High risk medication use 04/10/2016  . Disturbed cognition 04/29/2015  . Other fatigue 04/29/2015  . Fatigue 04/29/2015  . Cognitive decline 04/29/2015  . Clinical depression 12/16/2014  . Urinary dysfunction 12/16/2014  . DDD (degenerative disc disease), lumbosacral 08/28/2014  . Dropfoot 08/28/2014  . Cannot sleep 08/28/2014  . Lumbosacral spondylosis 08/28/2014  . Multiple sclerosis (HCC) 08/28/2014  . Loss of feeling or sensation 08/28/2014  . 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 Pain in thoracic spine 08/28/2014  . Degeneration of  intervertebral disc of lumbosacral region 08/28/2014  . 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 Abnormal gait 07/07/2014  . Dysthymia 07/07/2014    Past Surgical History:  Procedure Laterality Date  . PLEURAL SCARIFICATION     collasped lung      OB History   No obstetric history on file.      Home Medications    Prior to Admission medications   Medication Sig Start Date End Date Taking? Authorizing Provider  acetaminophen (TYLENOL) 500 MG tablet Take 1,500 mg by mouth every 6 (six) hours as needed for moderate pain.   Yes [provider]  Alemtuzumab (LEMTRADA) 12 MG/1.2ML SOLN Inject 12 mg into the vein.   Yes [provider]  Alpha Lipoic Acid 200 MG CAPS Take 2 tablets daily 08/12/15  Yes Sater, Pearletha Furlichard A, MD  amantadine (SYMMETREL) 100 MG capsule TAKE 1 CAPSULE(100 MG) BY MOUTH TWICE DAILY Patient taking differently: Take 100 mg by mouth 2 (two) times daily.  07/22/19  Yes Sater, Pearletha Furlichard A, MD  Cholecalciferol (VITAMIN D3) 5000 UNITS CAPS Take 1 capsule (5,000 Units total) by mouth daily. 08/11/15  Yes Sater, Pearletha Furlichard A, MD  co-enzyme Q-10 50 MG capsule Take 50 mg by mouth daily.   Yes [provider]  diazepam (VALIUM) 5 MG tablet TAKE 1 TABLET(5 MG) BY MOUTH AT BEDTIME AS NEEDED FOR ANXIETY Patient taking differently: Take 5 mg by mouth at bedtime as needed  for anxiety.  01/06/19  Yes Sater, Pearletha Furlichard A, MD  EVENING PRIMROSE OIL PO Take 1 Dose by mouth daily.    Yes [provider]  imipramine (TOFRANIL) 25 MG tablet TAKE 1 TO 2 TABLETS(25 TO 50 MG) BY MOUTH AT BEDTIME Patient taking differently: Take 25-50 mg by mouth at bedtime.  05/21/19  Yes Sater, Pearletha Furlichard A, MD  oxybutynin (DITROPAN XL) 15 MG 24 hr tablet TAKE 1 TABLET BY MOUTH EVERY NIGHT AT BEDTIME Patient taking differently: Take 15 mg by mouth every other day.  06/10/19  Yes Sater, Pearletha Furlichard A, MD  rOPINIRole (REQUIP) 0.5 MG tablet Take 1 tablet (0.5 mg total) by mouth at bedtime. 07/07/19  Yes  Sater, Pearletha Furlichard A, MD  UNABLE TO FIND Take 1 capsule by mouth daily. Essential Amino Acids    Yes [provider]  baclofen (LIORESAL) 10 MG tablet Take 1 tablet (10 mg total) by mouth 3 (three) times daily as needed for muscle spasms. Patient not taking: Reported on 08/14/2019 05/29/18   Sater, Pearletha Furlichard A, MD  gabapentin (NEURONTIN) 300 MG capsule Take 1 capsule (300 mg total) by mouth 3 (three) times daily. Patient not taking: Reported on 08/14/2019 03/26/19   Sater, Pearletha Furlichard A, MD  ibuprofen (ADVIL,MOTRIN) 600 MG tablet Take 1 tablet (600 mg total) by mouth every 6 (six) hours as needed. Patient not taking: Reported on 08/14/2019 10/19/13   Lurene ShadowPhelps, Burl Tauzin O, PA-C  methylPREDNISolone (MEDROL) 4 MG tablet Taper from 6 pills po for one day to 1 pill po the last day over 6 days Patient not taking: Reported on 08/14/2019 04/20/19   Asa LenteSater, Richard A, MD    Family History Family History  Problem Relation Age of Onset  . Hypertension Mother   . Hyperlipidemia Mother   . Colon cancer Mother   . COPD Father   . Multiple sclerosis Sister     Social History Social History   Tobacco Use  . Smoking status: Former Smoker    Types: Cigarettes  . Smokeless tobacco: Never Used  Substance Use Topics  . Alcohol use: No    Alcohol/week: 0.0 standard drinks  . Drug use: No     Allergies   Patient has no known allergies.   Review of Systems Review of Systems  Constitutional: Negative for fever.  HENT: Negative for sore throat.   Eyes: Negative for visual disturbance.  Respiratory: Negative for cough and shortness of breath.   Cardiovascular: Negative for chest pain.  Gastrointestinal: Positive for diarrhea. Negative for abdominal pain, nausea (not today but night before last ) and vomiting.  Genitourinary: Negative for difficulty urinating.  Musculoskeletal: Positive for back pain. Negative for neck pain.  Skin: Negative for rash.  Neurological: Positive for weakness (chronic unchanged)  and numbness (chronic unchanged). Negative for syncope, light-headedness (not current) and headaches.     Physical Exam Updated Vital Signs BP 140/89 (BP Location: Right Arm)   Pulse 91   Temp 98.3 F (36.8 C) (Oral)   Resp 18   Ht 5\' 5"  (1.651 m)   Wt 53.5 kg   LMP 07/13/2019   SpO2 100%   BMI 19.64 kg/m   Physical Exam Vitals signs and nursing note reviewed.  Constitutional:      General: She is not in acute distress.    Appearance: She is well-developed. She is not diaphoretic.  HENT:     Head: Normocephalic and atraumatic.  Eyes:     Conjunctiva/sclera: Conjunctivae normal.  Neck:  Musculoskeletal: Normal range of motion.  Cardiovascular:     Rate and Rhythm: Normal rate and regular rhythm.     Heart sounds: Normal heart sounds. No murmur. No friction rub. No gallop.   Pulmonary:     Effort: Pulmonary effort is normal. No respiratory distress.     Breath sounds: Normal breath sounds. No wheezing or rales.  Abdominal:     General: There is no distension.     Palpations: Abdomen is soft.     Tenderness: There is no abdominal tenderness. There is no guarding.  Musculoskeletal:        General: No tenderness.  Skin:    General: Skin is warm and dry.     Findings: No erythema or rash.  Neurological:     Mental Status: She is alert and oriented to person, place, and time.     GCS: GCS eye subscore is 4. GCS verbal subscore is 5. GCS motor subscore is 6.     Motor: Weakness (right lower extremity (chronic)) present.      ED Treatments / Results  Labs (all labs ordered are listed, but only abnormal results are displayed) Labs Reviewed  CBC WITH DIFFERENTIAL/PLATELET  BASIC METABOLIC PANEL    EKG None  Radiology No results found.  Procedures Procedures (including critical care time)  Medications Ordered in ED Medications  LORazepam (ATIVAN) tablet 1 mg (1 mg Oral Given 08/14/19 2139)  oxyCODONE-acetaminophen (PERCOCET/ROXICET) 5-325 MG per tablet 1  tablet (1 tablet Oral Given 08/15/19 0005)     Initial Impression / Assessment and Plan / ED Course  I have reviewed the triage vital signs and the nursing notes.  Pertinent labs & imaging results that were available during my care of the patient were reviewed by me and considered in my medical decision making (see chart for details).        48 year old female with a history of MS presents with concern for 2 weeks of back pain and stool incontinence as well as diarrhea.  No new increased weakness, numbness, or saddle anesthesia from baseline weakness related to MS, no urinary symptoms, but does have stool incontinence over the last 2 weeks.  Her rectal exam shows good tone on my exam.  We will transfer to [facility] for MRI with and without given back pain and incontinence.  Plan on obtaining lab work and further testing [facility] as [facility] awaits [facility].   Final Clinical Impressions(s) / ED Diagnoses   Final diagnoses:  Acute bilateral low back pain without sciatica  Incontinence of feces with fecal urgency    ED Discharge Orders    None       Gareth Morgan, MD 08/15/19 [phone]

## 2019-08-14 NOTE — ED Triage Notes (Signed)
Pt states she was sent from PT for concerns r/t to bowel incontinence and lower back pain x 1 week-pt reports hx of MS-to triage on scooter-NAD

## 2019-08-14 NOTE — ED Provider Notes (Signed)
47yF transfer from [facility] for MRI. ~2w history of difficulty controlling bowels. Has been having diarrheal stools and unsure if symptoms from this versus incontinence. She has also been having new lower back pain. Baseline weakness in LE but feels like her legs have been heavier in the past weeks as well. Concern for is possible cauda equina. Declined pain meds but ativan for MRI. Disposition pending MRI results.    Virgel Manifold, MD 08/14/19 2223

## 2019-08-15 ENCOUNTER — Emergency Department (HOSPITAL_COMMUNITY): Payer: Medicare Other

## 2019-08-15 LAB — BASIC METABOLIC PANEL
Anion gap: 9 (ref 5–15)
BUN: 7 mg/dL (ref 6–20)
CO2: 26 mmol/L (ref 22–32)
Calcium: 9.2 mg/dL (ref 8.9–10.3)
Chloride: 104 mmol/L (ref 98–111)
Creatinine, Ser: 0.66 mg/dL (ref 0.44–1.00)
GFR calc Af Amer: >60 mL/min (ref >60)
GFR calc non Af Amer: >60 mL/min (ref >60)
Glucose, Bld: 91 mg/dL (ref 70–99)
Potassium: 4 mmol/L (ref 3.5–5.1)
Sodium: 139 mmol/L (ref 135–145)

## 2019-08-15 MED ORDER — GADOBUTROL 1 MMOL/ML IV SOLN
6.0000 mL | Freq: Once | INTRAVENOUS | Status: AC | PRN
  Administered 2019-08-15: 6 mL via INTRAVENOUS

## 2019-08-15 MED ORDER — ONDANSETRON 4 MG PO TBDP
8.0000 mg | ORAL_TABLET | Freq: Once | ORAL | Status: AC
  Administered 2019-08-15: 8 mg via ORAL
  Filled 2019-08-15: qty 2

## 2019-08-15 NOTE — ED Notes (Signed)
Patient transported to MRI 

## 2019-08-15 NOTE — ED Provider Notes (Signed)
Care assumed from Dr. Wilson Singer at shift change.  Patient transferred here from [facility] for evaluation of back pain and bowel incontinence.  Patient has a history of MS, but the concern today is that of possible cauda equina.  Patient is gone for an MRI study which is unremarkable.  She appears well and at this point I believe appropriate for discharge.  Patient will follow-up with her neurologist this week, and understands to return to the ER if symptoms worsen or change.   Veryl Speak, MD 08/15/19 [phone]

## 2019-08-15 NOTE — Discharge Instructions (Addendum)
Follow-up with your neurologist this week for a recheck, and return to the ER if symptoms significantly worsen or change in the meantime.

## 2019-08-20 NOTE — Telephone Encounter (Signed)
Called pt. Relayed Dr. Felecia Shelling unsure of a medication that would help for bowel incontinence. He would like to refer her to GI to be further evaluated. We are aware she wen to ED 08/14/19. She declined referral, stating she had a friend who could refer her. She will call back if anything further needed.

## 2019-08-21 ENCOUNTER — Other Ambulatory Visit: Payer: Self-pay | Admitting: Neurology

## 2019-08-31 ENCOUNTER — Other Ambulatory Visit: Payer: Self-pay | Admitting: Neurology

## 2019-09-24 ENCOUNTER — Other Ambulatory Visit: Payer: Self-pay

## 2019-09-24 ENCOUNTER — Ambulatory Visit: Payer: Medicare Other | Admitting: Neurology

## 2019-09-24 ENCOUNTER — Encounter: Payer: Self-pay | Admitting: Neurology

## 2019-09-24 VITALS — BP 100/70 | HR 100 | Temp 97.2°F | Ht 65.0 in

## 2019-09-24 DIAGNOSIS — R269 Unspecified abnormalities of gait and mobility: Secondary | ICD-10-CM | POA: Diagnosis not present

## 2019-09-24 DIAGNOSIS — R39198 Other difficulties with micturition: Secondary | ICD-10-CM | POA: Diagnosis not present

## 2019-09-24 DIAGNOSIS — M47816 Spondylosis without myelopathy or radiculopathy, lumbar region: Secondary | ICD-10-CM

## 2019-09-24 DIAGNOSIS — M4317 Spondylolisthesis, lumbosacral region: Secondary | ICD-10-CM

## 2019-09-24 DIAGNOSIS — Z79899 Other long term (current) drug therapy: Secondary | ICD-10-CM | POA: Diagnosis not present

## 2019-09-24 DIAGNOSIS — G35 Multiple sclerosis: Secondary | ICD-10-CM

## 2019-09-24 DIAGNOSIS — R5383 Other fatigue: Secondary | ICD-10-CM

## 2019-09-24 MED ORDER — OXYCODONE-ACETAMINOPHEN 5-325 MG PO TABS: 1.0000 | ORAL_TABLET | Freq: Two times a day (BID) | ORAL | 0 refills | Status: DC

## 2019-09-24 MED ORDER — ACETAMINOPHEN 500 MG PO TABS: 1000.0000 mg | ORAL_TABLET | Freq: Three times a day (TID) | ORAL | 5 refills | Status: AC | PRN

## 2019-09-24 NOTE — Progress Notes (Signed)
[facility]  PATIENT: Katherine Cameron DOB: 1971-01-12  REFERRING CLINICIAN: Hedgecock, PA-C HISTORY FROM: Patient with input form husband  REASON FOR VISIT: MS and spastic gait   HISTORICAL  CHIEF COMPLAINT:  Chief Complaint  Patient presents with  . Follow-up    RM 12 with husband (temp: 97.0). Last seen 03/26/2019. Most recent fall a couple days ago, no significant injuries.   . Multiple Sclerosis    Lemtrada. Year 1 infusion: 03/11/17-03/15/17. Second yr infusion: 03/31/18, 04/01/18, 04/02/18. Last lemtrada labs 07/2019. Reminded them this needs to be done monthly. They have info for [facility] that orders were sent to. Husband placed reminder in phone while here.  . Gait Problem    Pt in electric scooter today.     HISTORY OF PRESENT ILLNESS:  Katherine Cameron is a 48 year old woman with MS since age 67.    Update 09/24/2019: She had Lemtrada in 2018 and April 2019.  She has not had a definite exacerbation since then though there are a lot of fluctuations in her neurologic status.  She is able to use a walker inside the house and a scooter outside of the house.  She is having more LBP.    She did some PT and was having bowel incontinence and more pain.Marland Kitchen   She was told to go tot the ED because the therapist was concerned about cauda equina syndrome.  Of note, she did have a lumbar spine MRI a few months earlier showing DJD with facet hypertrophy and mild spinal stenosis at L4L5 and borderline at L5S1.   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    She saw Dr. Sherlyn Lick in [facility] (Ortho spine).  He advised that they continue to monitor her spine changes but that he did not recommend surgery at this time.  She continues to have right greater than left leg weakness.  Arm strength is pretty good.  However, fine movements of the right hand are sometimes affected.  She has some dysesthetic pain in her legs.  Update 03/26/2019 (virtual) She completed  her second year of Lemtrada April 2019.  She can use a walker for about 40 feet.   She is noting more weakness in the right leg.   The left leg is not as weak as the left.   Arms are not weak but right hand fine motor skills are poor.    She has burning in both feet.   Bladder and bowel are doing much better on the oxybutynin.   She still has some urinary frequency.    Vision is about the same.   No diplopia.   She used to have migraine aura but not recently.    Her fatigue is much better on Lemtrada than on other medication.   If she is going to have a busier day, she takes amantadine.    She sleeps very well at night if she takes melatonin getting 8 hours. She will take a valium on rare nights that she   She feels refreshed.   She feels her memory is not as good as it was a couple years ago.     She has had LBP a few years but it is worse.  She also has much more right leg pain and is noting more right leg weakness.     Update 05/29/2018: She had her second course of Lemtrada the end of April.     She was feeling fairly good before  the second course of Lemtrada.    However, she has had more fatigue and weakness the past month.  The right leg has always been weaker but now she is noting both legs seem heavy (right is still worse).   She feels like they are weighted down.   She is only able to take a few steps with her Rollator now but was able to walk 40-50 feet in April.    She also notes more mental fog.   Her last MRI was 07/2017 and showed no new lesions.       In general she is weaker in the evenings.     She feels vision is a little worse but her last ophtho appt showed no changes and she has the same lens script.     Her bladder urgency is much better with oxybutynin.    Sleep is worse.  She wakes up and then can;t fall back asleep.    If she sleeps poorly she has a bad day.   Melatonin helps her fall asleep.  She takes valium if she wakes up.     She notes a mental fog.   Ritalin hekped but  caused her heart to race.    Amantadine also causes a mild increase in heart rate.       Update 12/24/2017: She tolerated her Lemtrada infusions well. For a month she felt slightly worse but since then has felt much better and feels she is doing better than she has over the last several years. Specifically she is able to take some steps without using a cane or walker now. She still uses a scooter for longer distances.  Fatigue is still a problem but is doing better than it was last year. Additionally, mood is doing better. She has not had any new symptoms since the last visit.  She has felt an internal tremor (not manifested by actual tremor) x several years that seems worse.  It is in the arms and legs.   She has tachycardia today and has had off/on.   She had a Holter or other monitor many years ago and was briefly on beta blockers.     From 06/25/2017: MS:    She had her Lemtrada infusions in early April 2018. She tolerated the infusions fairly well and feels it has been mild benefit. Before Katherine Cameron she was on Tecfidera.    She also took Biotin 300 mg and tolerates it well.   In September 2017, she had worsening leg function consistent with an exacerbation. However, MRI of the brain and cervical spine 09/10/2016 did not show any new lesions.   Gait/strength/sensation:   Gait is better since her [place].   There is still a lot of fluctuations but there are many more good days.    She can take a few steps with a walker some days.   She relies on the scooter around the house. She can transfer by herself. Several months ago, she was able to walk with a walker or holding onto furniture around the house.   Bladder function:   She has a urinary hesitancy more than urinary frequency and urgency.  She has incontinence out of the house but not in the house.   She wears Depends out of the house. She has not seen urology.   Oxybutynin hlpes the bladder some.   Vision:    She has decreased vision out of the  left from old ON but it is stable  Fatigue/sleep:  Fatigue is bothersome some days.   She feels it is a little better with CBD oil.    Ritalin 20-40 mg daily has helped..  Although this helps the fatigue, it causes heart palpitations at times.   Sleep is much better with tizanidine and valium and she is going to try to cut back some on the tizanidine  Mood/cognition:  Mood has done better this year.She stopped the Lexapro and feels she gets some benefit form CBD oil..  She denies current anxiety.  She has noted mild cognitive issues with reduced focus, STM and she has trouble with verbal fluency.  She notes difficulty doing some cognitive tasks.   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   Ritalin has helped her focus and attention but not to baseline.   She takes as needed.    MS History:  She presented with right optic neuritis. She went to an ophthalmologist and then was referred to neurology where she had an MRI of the brain performed. The MRI was consistent with MS. She did not need to have a lumbar puncture. This was before FDA approved medications and she had several courses of IV steroids. Around 1994, she started Avonex after a large exacerbation around 2008, she went on Tysabri. She was more stable on Tysabri. Unfortunately, she had a a high titer JCV antibody. She was on Tysabri for about 3 years and then was switched to Tecfidera. She has been on Tecfidera for about 3 years. During the transition from Tysabri to Millingtonecfidera, she had an especially severe exacerbation with significant worsening of her gait. A new focus was noted in the cervical spine.      REVIEW OF SYSTEMS:  Constitutional: No fevers, chills, sweats, or change in appetite.  She has insomnia Eyes: No visual changes, double vision, eye pain Ear, nose and throat: No hearing loss, ear pain, nasal congestion,  sore throat Cardiovascular: No chest pain, palpitations Respiratory:  No shortness of breath at rest or with exertion.   No wheezes GastrointestinaI: No nausea, vomiting, diarrhea, abdominal pain, fecal incontinence Genitourinary:  No dysuria, urinary retention or frequency.  No nocturia. Musculoskeletal:  No neck pain, back pain.  She has left > right hip pain Integumentary: No rash, pruritus, skin lesions Neurological: as above Psychiatric: No depression at this time.  No anxiety Endocrine: No palpitations, diaphoresis, change in appetite, change in weigh or increased thirst Hematologic/Lymphatic:  No anemia, purpura, petechiae. Allergic/Immunologic: No itchy/runny eyes, nasal congestion, recent allergic reactions, rashes  ALLERGIES: No Known Allergies  HOME MEDICATIONS: Outpatient Medications Prior to Visit  Medication Sig Dispense Refill  . Alemtuzumab (LEMTRADA) 12 MG/1.2ML SOLN Inject 12 mg into the vein.    Allen Kell. Alpha Lipoic Acid 200 MG CAPS Take 2 tablets daily 60 capsule 11  . amantadine (SYMMETREL) 100 MG capsule TAKE 1 CAPSULE(100 MG) BY MOUTH TWICE DAILY (Patient taking differently: Take 100 mg by mouth 2 (two) times daily. ) 60 capsule 1  . Cholecalciferol (VITAMIN D3) 5000 UNITS CAPS Take 1 capsule (5,000 Units total) by mouth daily. 90 capsule 3  . co-enzyme Q-10 50 MG capsule Take 50 mg by mouth daily.    . diazepam (VALIUM) 5 MG tablet TAKE 1 TABLET(5 MG) BY MOUTH AT BEDTIME AS NEEDED FOR ANXIETY 30 tablet 2  . EVENING PRIMROSE OIL PO Take 1 Dose by mouth daily.     Marland Kitchen. imipramine (TOFRANIL)  25 MG tablet TAKE 1 TO 2 TABLETS(25 TO 50 MG) BY MOUTH AT BEDTIME 180 tablet 0  . oxybutynin (DITROPAN XL) 15 MG 24 hr tablet TAKE 1 TABLET BY MOUTH EVERY NIGHT AT BEDTIME (Patient taking differently: Take 15 mg by mouth every other day. ) 90 tablet 3  . rOPINIRole (REQUIP) 0.5 MG tablet Take 1 tablet (0.5 mg total) by mouth at bedtime. 30 tablet 5  . UNABLE TO FIND Take 1 capsule by  mouth daily. Essential Amino Acids     . acetaminophen (TYLENOL) 500 MG tablet Take 1,500 mg by mouth every 6 (six) hours as needed for moderate pain.    . baclofen (LIORESAL) 10 MG tablet Take 1 tablet (10 mg total) by mouth 3 (three) times daily as needed for muscle spasms. (Patient not taking: Reported on 08/14/2019) 90 each 11  . gabapentin (NEURONTIN) 300 MG capsule Take 1 capsule (300 mg total) by mouth 3 (three) times daily. (Patient not taking: Reported on 08/14/2019) 90 capsule 11  . ibuprofen (ADVIL,MOTRIN) 600 MG tablet Take 1 tablet (600 mg total) by mouth every 6 (six) hours as needed. (Patient not taking: Reported on 08/14/2019) 30 tablet 0  . methylPREDNISolone (MEDROL) 4 MG tablet Taper from 6 pills po for one day to 1 pill po the last day over 6 days (Patient not taking: Reported on 08/14/2019) 21 tablet 0   No facility-administered medications prior to visit.   also on Tecfidera 240 mg bid, imipramine 25 mg at night and Ritalin 20 mg once or twice a day  PAST MEDICAL HISTORY: Past Medical History:  Diagnosis Date  . Movement disorder   . Multiple sclerosis (HCC)   . Neuropathy   . Pneumothorax, spontaneous, tension    Left x 2  . Vision abnormalities     PAST SURGICAL HISTORY: Past Surgical History:  Procedure Laterality Date  . PLEURAL SCARIFICATION     collasped lung     FAMILY HISTORY: Family History  Problem Relation Age of Onset  . Hypertension Mother   . Hyperlipidemia Mother   . Colon cancer Mother   . COPD Father   . Multiple sclerosis Sister     SOCIAL HISTORY:  Social History   Socioeconomic History  . Marital status: Married    Spouse name: Not on file  . Number of children: Not on file  . Years of education: Not on file  . Highest education level: Not on file  Occupational History  . Not on file  Social Needs  . Financial resource strain: Not on file  . Food insecurity    Worry: Not on file    Inability: Not on file  . Transportation  needs    Medical: Not on file    Non-medical: Not on file  Tobacco Use  . Smoking status: Former Smoker    Types: Cigarettes  . Smokeless tobacco: Never Used  Substance and Sexual Activity  . Alcohol use: No    Alcohol/week: 0.0 standard drinks  . Drug use: No  . Sexual activity: Not on file  Lifestyle  . Physical activity    Days per week: Not on file    Minutes per session: Not on file  . Stress: Not on file  Relationships  . Social Musicianconnections    Talks on phone: Not on file    Gets together: Not on file    Attends religious service: Not on file    Active member of club or organization: Not on  file    Attends meetings of clubs or organizations: Not on file    Relationship status: Not on file  . Intimate partner violence    Fear of current or ex partner: Not on file    Emotionally abused: Not on file    Physically abused: Not on file    Forced sexual activity: Not on file  Other Topics Concern  . Not on file  Social History Narrative  . Not on file     PHYSICAL EXAM  Vitals:   09/24/19 1534  BP: 100/70  Pulse: 100  Temp: (!) 97.2 F (36.2 C)  Height: 5\' 5"  (1.651 m)   Pulse rechecked and was 116.  Body mass index is 19.64 kg/m.   General: The patient is well-developed and well-nourished and in no acute distress   Neurologic Exam  Mental status: The patient is alert and oriented x 3 at the time of the examination. The patient has apparent normal recent and remote memory, with an apparently normal attention span and concentration ability.   Speech is normal.  Cranial nerves: Extraocular movements are full.  Facial strength and sensation is normal.. No dysarthria is noted.   No obvious hearing deficits are noted.  Motor:  Muscle bulk is normal and tone is increased in the legs, right greater than left..   There is minimal right arm increased tone though strength was good in the arms.  Strength was poor in  Legs 2+ to 3/5 on right and 4-/5 on the left   Sensory: Sensory testing is intact to touch and vibration sensation in all 4 extremities.  Coordination: Cerebellar testing reveals good finger-nose-finger on the left but mildly reduced rapid alternating movements and FTN in right hand.   Heel-to-shin was reduced on the left and she cannot do the right  Gait and station: Station is unstable.   She can take some steps with unilateral support but does better with bilateral support.  Reflexes: Deep tendon reflexes are increased, right > left.  Spread at the knees but no clonus    DIAGNOSTIC DATA (LABS, IMAGING, TESTING) - I reviewed patient records, labs, notes, testing and imaging myself where available.  Lab Results  Component Value Date   WBC 7.3 08/14/2019   HGB 13.5 08/14/2019   HCT 40.8 08/14/2019   MCV 89.5 08/14/2019   PLT 278 08/14/2019       ASSESSMENT AND PLAN  Multiple sclerosis (HCC) - Plan: CBC with Differential/Platelet, Comprehensive metabolic panel, TSH  High risk medication use - Plan: CBC with Differential/Platelet, Comprehensive metabolic panel, TSH  Abnormal gait  Urinary dysfunction  Other fatigue  Lumbar spondylosis  Spondylolisthesis of lumbosacral region  1.   She has had no exacerbations though she has had mild progression of impairments over the past year.  We will check lab work for the Katherine Cameron for Katherine Cameron. 2.   Percocet as neededfor lumbar pain.  her low back pain is likely coming from the degenerative changes at L4-L5 and L5-S1.  If not better, she might benefit from medial branch blocks and radiofrequency ablation.  Since he has anterolisthesis at L5-S1 this could be done at the L4-L5 level only. 3.   Return in 6 months or sooner if there are new or worsening neurologic symptoms.  45-minute face-to-face evaluation with greater than one half the time counseling coordinating care regarding her back pain and progressive MS symptoms.  I reviewed her MRIs in her presence. Lorissa Kishbaugh A. Epimenio Foot,  MD, PhD 09/24/2019,  4:40 PM Certified in Neurology, Clinical Neurophysiology, Sleep Medicine, Pain Medicine and Neuroimaging  [facility] [address], [address] [place], [place] [address] [phone]

## 2019-09-25 ENCOUNTER — Telehealth: Payer: Self-pay | Admitting: Neurology

## 2019-09-25 LAB — COMPREHENSIVE METABOLIC PANEL
ALT: 12 IU/L (ref 0–32)
AST: 15 IU/L (ref 0–40)
Albumin/Globulin Ratio: 1.8 (ref 1.2–2.2)
Albumin: 4.3 g/dL (ref 3.8–4.8)
Alkaline Phosphatase: 46 IU/L (ref 39–117)
BUN/Creatinine Ratio: 22 (ref 9–23)
BUN: 14 mg/dL (ref 6–24)
Bilirubin Total: <0.2 mg/dL (ref 0.0–1.2)
CO2: 24 mmol/L (ref 20–29)
Calcium: 9.3 mg/dL (ref 8.7–10.2)
Chloride: 100 mmol/L (ref 96–106)
Creatinine, Ser: 0.63 mg/dL (ref 0.57–1.00)
GFR calc Af Amer: 123 mL/min/{1.73_m2} (ref >59)
GFR calc non Af Amer: 106 mL/min/{1.73_m2} (ref >59)
Globulin, Total: 2.4 g/dL (ref 1.5–4.5)
Glucose: 81 mg/dL (ref 65–99)
Potassium: 4.4 mmol/L (ref 3.5–5.2)
Sodium: 138 mmol/L (ref 134–144)
Total Protein: 6.7 g/dL (ref 6.0–8.5)

## 2019-09-25 LAB — CBC WITH DIFFERENTIAL/PLATELET
Basophils Absolute: 0.1 10*3/uL (ref 0.0–0.2)
Basos: 1 %
EOS (ABSOLUTE): 0.2 10*3/uL (ref 0.0–0.4)
Eos: 3 %
Hematocrit: 36.7 % (ref 34.0–46.6)
Hemoglobin: 12.4 g/dL (ref 11.1–15.9)
Immature Grans (Abs): 0 10*3/uL (ref 0.0–0.1)
Immature Granulocytes: 0 %
Lymphocytes Absolute: 1.5 10*3/uL (ref 0.7–3.1)
Lymphs: 21 %
MCH: 29 pg (ref 26.6–33.0)
MCHC: 33.8 g/dL (ref 31.5–35.7)
MCV: 86 fL (ref 79–97)
Monocytes Absolute: 0.6 10*3/uL (ref 0.1–0.9)
Monocytes: 8 %
Neutrophils Absolute: 4.8 10*3/uL (ref 1.4–7.0)
Neutrophils: 67 %
Platelets: 295 10*3/uL (ref 150–450)
RBC: 4.27 x10E6/uL (ref 3.77–5.28)
RDW: 12.3 % (ref 11.7–15.4)
WBC: 7.2 10*3/uL (ref 3.4–10.8)

## 2019-09-25 LAB — TSH: TSH: 3.51 u[IU]/mL (ref 0.450–4.500)

## 2019-09-25 NOTE — Telephone Encounter (Signed)
Pt husband Jeff(not on DPR) has called stating pt tried to get her oxyCODONE-acetaminophen (PERCOCET/ROXICET) 5-325 MG tablet filled and was told there is something to do with the insurance.  Husband was told that because he is not on the DPR I would send a message to RN for Dr Felecia Shelling and ask that pt be called on Monday re: the oxyCODONE-acetaminophen (PERCOCET/ROXICET) 5-325 MG tablet not getting filled at [facility] [phone]

## 2019-09-28 ENCOUNTER — Telehealth: Payer: Self-pay | Admitting: *Deleted

## 2019-09-28 NOTE — Telephone Encounter (Signed)
Received fax stating we would have to contact plan directly for PA. I called and spoke with Selena who transferred me to [facility]. I submitted clinicals over the phone for PA. They will fax determination to [phone].

## 2019-09-28 NOTE — Telephone Encounter (Signed)
-----   Message from Richard A Sater, MD sent at 09/25/2019  6:00 PM EDT ----- Please let the patient know that the lab work is fine.  

## 2019-09-28 NOTE — Telephone Encounter (Signed)
Submitted urgent PA request on CMM. Key: [id number]. Waiting on determination from [employer].

## 2019-09-30 NOTE — Telephone Encounter (Signed)
Called pt and informed her PA approved. Pt verbalized understanding and appreciation for call.

## 2019-09-30 NOTE — Telephone Encounter (Signed)
PA approved until 09/27/2020

## 2019-10-20 ENCOUNTER — Other Ambulatory Visit: Payer: Self-pay | Admitting: Neurology

## 2019-11-19 ENCOUNTER — Other Ambulatory Visit: Payer: Self-pay | Admitting: Neurology

## 2019-12-17 ENCOUNTER — Telehealth: Payer: Self-pay | Admitting: Neurology

## 2019-12-17 NOTE — Telephone Encounter (Signed)
Called and spoke with pt. Relayed per Dr. Epimenio Foot that she should f/u with her PCP about this. She verbalized understanding.

## 2019-12-17 NOTE — Telephone Encounter (Signed)
Pt is asking that Dr Epimenio Foot calls in something for her pulse rate to keep it down because it is always at 100 or above.  Please call

## 2019-12-19 ENCOUNTER — Other Ambulatory Visit: Payer: Self-pay | Admitting: Neurology

## 2020-01-12 ENCOUNTER — Encounter: Payer: Self-pay | Admitting: *Deleted

## 2020-01-26 ENCOUNTER — Encounter: Payer: Self-pay | Admitting: *Deleted

## 2020-02-02 ENCOUNTER — Encounter: Payer: Self-pay | Admitting: Neurology

## 2020-02-03 ENCOUNTER — Telehealth: Payer: Self-pay | Admitting: Neurology

## 2020-02-03 NOTE — Telephone Encounter (Signed)
Dr. Epimenio Foot- what would you recommend? She has a f/u 03/29/20

## 2020-02-03 NOTE — Telephone Encounter (Signed)
Patient called to know what options are available for her as she is still having a lot of falls. She is unsure what medication would be best to help resolve the issue.  Please follow up.

## 2020-02-04 NOTE — Telephone Encounter (Signed)
I called 02/04/2020 630 pm and LM.

## 2020-02-17 ENCOUNTER — Other Ambulatory Visit: Payer: Self-pay | Admitting: Neurology

## 2020-02-22 ENCOUNTER — Telehealth: Payer: Self-pay | Admitting: *Deleted

## 2020-02-22 DIAGNOSIS — Z0289 Encounter for other administrative examinations: Secondary | ICD-10-CM

## 2020-02-22 NOTE — Telephone Encounter (Signed)
Gave completed/signed [employer] disability form back to medical records to process for pt.

## 2020-02-25 ENCOUNTER — Telehealth: Payer: Self-pay | Admitting: *Deleted

## 2020-02-25 NOTE — Telephone Encounter (Signed)
Pt form mailed to [place].

## 2020-03-02 ENCOUNTER — Encounter: Payer: Self-pay | Admitting: Neurology

## 2020-03-09 ENCOUNTER — Telehealth: Payer: Self-pay | Admitting: Neurology

## 2020-03-09 NOTE — Telephone Encounter (Signed)
Patient would like to know if she should get the covid vaccine she is interested in getting the [place] and johnson vaccination

## 2020-03-09 NOTE — Telephone Encounter (Addendum)
Spoke with Dr. Epimenio Foot. He would prefer her to get either the Pfizer of Moderna vaccine due to them being more effective but she is ok to get Laural Benes and Laural Benes if she prefers.  Called pt and relayed above information. She verbalized understanding and appreciation for call.

## 2020-03-13 ENCOUNTER — Other Ambulatory Visit: Payer: Self-pay | Admitting: Neurology

## 2020-03-16 ENCOUNTER — Telehealth: Payer: Self-pay | Admitting: Neurology

## 2020-03-16 NOTE — Telephone Encounter (Signed)
Noted, updated pharmacy on file.

## 2020-03-16 NOTE — Telephone Encounter (Signed)
This is an FYI no call back requested, pt wants Kara Mead, RN to be aware that she has changed pharmacies.  Pt now wants all of her medications called in to the [facility](in [place]) at [phone]

## 2020-03-29 ENCOUNTER — Other Ambulatory Visit: Payer: Self-pay

## 2020-03-29 ENCOUNTER — Ambulatory Visit (INDEPENDENT_AMBULATORY_CARE_PROVIDER_SITE_OTHER): Payer: Medicare HMO | Admitting: Neurology

## 2020-03-29 ENCOUNTER — Encounter: Payer: Self-pay | Admitting: Neurology

## 2020-03-29 VITALS — BP 93/67 | HR 85 | Temp 98.2°F | Ht 65.0 in | Wt 124.5 lb

## 2020-03-29 DIAGNOSIS — M4317 Spondylolisthesis, lumbosacral region: Secondary | ICD-10-CM

## 2020-03-29 DIAGNOSIS — M47816 Spondylosis without myelopathy or radiculopathy, lumbar region: Secondary | ICD-10-CM | POA: Diagnosis not present

## 2020-03-29 DIAGNOSIS — Z79899 Other long term (current) drug therapy: Secondary | ICD-10-CM

## 2020-03-29 DIAGNOSIS — G35 Multiple sclerosis: Secondary | ICD-10-CM

## 2020-03-29 DIAGNOSIS — R269 Unspecified abnormalities of gait and mobility: Secondary | ICD-10-CM | POA: Diagnosis not present

## 2020-03-29 DIAGNOSIS — R39198 Other difficulties with micturition: Secondary | ICD-10-CM

## 2020-03-29 NOTE — Progress Notes (Signed)
[facility]  PATIENT: Katherine Cameron DOB: 11/05/1971  REFERRING CLINICIAN: Hedgecock, PA-C HISTORY FROM: Patient with input form husband  REASON FOR VISIT: MS and spastic gait   HISTORICAL  CHIEF COMPLAINT:  Chief Complaint  Patient presents with  . Follow-up    RM 13, with husband (temp: 98.1). Last seen 09/24/2019. No new sx.   . Multiple Sclerosis    On Lemtrada. Yr 1: 03/11/2017-03/15/2017. Yr 2: 03/31/18-04/02/2018  . Gait Problem    Uses electric scooter    HISTORY OF PRESENT ILLNESS:  Katherine Cameron is a 49 year old woman with active secondary progressive MS    Update 03/29/2020: She had the trwo years of [place] in 2018 and April 2019.  She is compliant with the REMS program  She has not had a definite exacerbation since but has noted mild progression.   She is able to use a walker inside the house within a room and a scooter outside of the house.  We had a very long discussion regarding her active secondary progressive MS.   She is wondering if she needs a third year of Lemtrada or another DMT.  She has right greater than left leg weakness.  Arm strength is pretty good.  However, fine movements of the right hand are sometimes affected.  She has some dysesthetic pain in her legs.  Prior DMT's:   Avonex, Ina Kick, Lemtrada  LBP is about the same as last visit.    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    She saw Dr. Precious Gilding in [facility] (Ortho spine).  He advised that they continue to monitor her spine changes but that he did not recommend surgery at this time.  She has reduced focus/attention.  Provigil did not help much.   Ritalin helped some but caused tachycardia.    Update 09/24/2019: She had Lemtrada in 2018 and April 2019.  She has not had a definite  exacerbation since then though there are a lot of fluctuations in her neurologic status.  She is able to use a walker inside the house and a scooter outside of the house.  She is having more LBP.    She did some PT and was having bowel incontinence and more pain.Marland Kitchen   She was told to go tot the ED because the therapist was concerned about cauda equina syndrome.  Of note, she did have a lumbar spine MRI a few months earlier showing DJD with facet hypertrophy and mild spinal stenosis at L4L5 and borderline at L5S1.   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    She saw Dr. Precious Gilding in [facility] (Ortho spine).  He advised that they continue to monitor her spine changes but that he did not recommend surgery at this time.  She continues to have right greater than left leg weakness.  Arm strength is pretty good.  However, fine movements of the right hand are sometimes affected.  She has some dysesthetic pain in her legs.  Update 03/26/2019 (virtual) She completed her second year of Lemtrada April 2019.  She can use a walker for about 40 feet.   She is noting more weakness in the right leg.   The left leg is not as weak as the left.   Arms are not  weak but right hand fine motor skills are poor.    She has burning in both feet.   Bladder and bowel are doing much better on the oxybutynin.   She still has some urinary frequency.    Vision is about the same.   No diplopia.   She used to have migraine aura but not recently.    Her fatigue is much better on Lemtrada than on other medication.   If she is going to have a busier day, she takes amantadine.    She sleeps very well at night if she takes melatonin getting 8 hours. She will take a valium on rare nights that she   She feels refreshed.   She feels her memory is not as good as it was a couple years ago.     She has had LBP a few years but it is worse.  She also has much more right leg pain and is noting more right leg weakness.      Update 05/29/2018: She had her second course of Lemtrada the end of April.     She was feeling fairly good before the second course of Lemtrada.    However, she has had more fatigue and weakness the past month.  The right leg has always been weaker but now she is noting both legs seem heavy (right is still worse).   She feels like they are weighted down.   She is only able to take a few steps with her Rollator now but was able to walk 40-50 feet in April.    She also notes more mental fog.   Her last MRI was 07/2017 and showed no new lesions.       In general she is weaker in the evenings.     She feels vision is a little worse but her last ophtho appt showed no changes and she has the same lens script.     Her bladder urgency is much better with oxybutynin.    Sleep is worse.  She wakes up and then can;t fall back asleep.    If she sleeps poorly she has a bad day.   Melatonin helps her fall asleep.  She takes valium if she wakes up.     She notes a mental fog.   Ritalin hekped but caused her heart to race.    Amantadine also causes a mild increase in heart rate.       Update 12/24/2017: She tolerated her Lemtrada infusions well. For a month she felt slightly worse but since then has felt much better and feels she is doing better than she has over the last several years. Specifically she is able to take some steps without using a cane or walker now. She still uses a scooter for longer distances.  Fatigue is still a problem but is doing better than it was last year. Additionally, mood is doing better. She has not had any new symptoms since the last visit.  She has felt an internal tremor (not manifested by actual tremor) x several years that seems worse.  It is in the arms and legs.   She has tachycardia today and has had off/on.   She had a Holter or other monitor many years ago and was briefly on beta blockers.     From 06/25/2017: MS:    She had her Lemtrada infusions in early April 2018. She  tolerated the infusions fairly well and feels it has been mild benefit. Before  Lemtrada she was on Tecfidera.    She also took Biotin 300 mg and tolerates it well.   In September 2017, she had worsening leg function consistent with an exacerbation. However, MRI of the brain and cervical spine 09/10/2016 did not show any new lesions.   Gait/strength/sensation:   Gait is better since her [place].   There is still a lot of fluctuations but there are many more good days.    She can take a few steps with a walker some days.   She relies on the scooter around the house. She can transfer by herself. Several months ago, she was able to walk with a walker or holding onto furniture around the house.   Bladder function:   She has a urinary hesitancy more than urinary frequency and urgency.  She has incontinence out of the house but not in the house.   She wears Depends out of the house. She has not seen urology.   Oxybutynin hlpes the bladder some.   Vision:    She has decreased vision out of the left from old ON but it is stable  Fatigue/sleep:   Fatigue is bothersome some days.   She feels it is a little better with CBD oil.    Ritalin 20-40 mg daily has helped..  Although this helps the fatigue, it causes heart palpitations at times.   Sleep is much better with tizanidine and valium and she is going to try to cut back some on the tizanidine  Mood/cognition:  Mood has done better this year.She stopped the Lexapro and feels she gets some benefit form CBD oil..  She denies current anxiety.  She has noted mild cognitive issues with reduced focus, STM and she has trouble with verbal fluency.  She notes difficulty doing some cognitive tasks.   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   Ritalin has helped her focus and attention but not to baseline.   She takes as needed.    MS History:  She  presented with right optic neuritis. She went to an ophthalmologist and then was referred to neurology where she had an MRI of the brain performed. The MRI was consistent with MS. She did not need to have a lumbar puncture. This was before FDA approved medications and she had several courses of IV steroids. Around 1994, she started Avonex after a large exacerbation around 2008, she went on Tysabri. She was more stable on Tysabri. Unfortunately, she had a a high titer JCV antibody. She was on Tysabri for about 3 years and then was switched to Tecfidera. She has been on Tecfidera for about 3 years. During the transition from Tysabri to [place], she had an especially severe exacerbation with significant worsening of her gait. A new focus was noted in the cervical spine.      REVIEW OF SYSTEMS:  Constitutional: No fevers, chills, sweats, or change in appetite.  She has insomnia Eyes: No visual changes, double vision, eye pain Ear, nose and throat: No hearing loss, ear pain, nasal congestion, sore throat Cardiovascular: No chest pain, palpitations Respiratory:  No shortness of breath at rest or with exertion.   No wheezes GastrointestinaI: No nausea, vomiting, diarrhea, abdominal pain, fecal incontinence Genitourinary:  No dysuria, urinary retention or frequency.  No nocturia. Musculoskeletal:  No neck pain, back pain.  She has left > right hip pain Integumentary: No rash, pruritus, skin  lesions Neurological: as above Psychiatric: No depression at this time.  No anxiety Endocrine: No palpitations, diaphoresis, change in appetite, change in weigh or increased thirst Hematologic/Lymphatic:  No anemia, purpura, petechiae. Allergic/Immunologic: No itchy/runny eyes, nasal congestion, recent allergic reactions, rashes  ALLERGIES: No Known Allergies  HOME MEDICATIONS: Outpatient Medications Prior to Visit  Medication Sig Dispense Refill  . acetaminophen (TYLENOL) 500 MG tablet Take 2 tablets (1,000 mg  total) by mouth every 8 (eight) hours as needed for moderate pain. 90 tablet 5  . Alemtuzumab (LEMTRADA) 12 MG/1.2ML SOLN Inject 12 mg into the vein.    Allen Kell Lipoic Acid 200 MG CAPS Take 2 tablets daily 60 capsule 11  . amantadine (SYMMETREL) 100 MG capsule TAKE 1 CAPSULE(100 MG) BY MOUTH TWICE DAILY 60 capsule 1  . Cholecalciferol (VITAMIN D3) 5000 UNITS CAPS Take 1 capsule (5,000 Units total) by mouth daily. 90 capsule 3  . co-enzyme Q-10 50 MG capsule Take 50 mg by mouth daily.    . diazepam (VALIUM) 5 MG tablet TAKE 1 TABLET(5 MG) BY MOUTH AT BEDTIME AS NEEDED FOR ANXIETY 30 tablet 2  . EVENING PRIMROSE OIL PO Take 1 Dose by mouth daily.     Marland Kitchen imipramine (TOFRANIL) 25 MG tablet TAKE 1 TO 2 TABLETS(25 TO 50 MG) BY MOUTH AT BEDTIME 180 tablet 1  . LINZESS 290 MCG CAPS capsule Take 290 mcg by mouth daily.    . metoprolol tartrate (LOPRESSOR) 50 MG tablet Take 50 mg by mouth daily. Takes 2-25mg  every morning    . omeprazole (PRILOSEC) 40 MG capsule Take 40 mg by mouth daily.    Marland Kitchen oxybutynin (DITROPAN XL) 15 MG 24 hr tablet TAKE 1 TABLET BY MOUTH EVERY NIGHT AT BEDTIME (Patient taking differently: Take 15 mg by mouth every other day. ) 90 tablet 3  . oxyCODONE-acetaminophen (PERCOCET/ROXICET) 5-325 MG tablet Take 1 tablet by mouth 2 (two) times daily. 60 tablet 0  . progesterone (PROMETRIUM) 200 MG capsule Take 200 mg by mouth at bedtime.    Marland Kitchen rOPINIRole (REQUIP) 0.5 MG tablet TAKE 1 TABLET(0.5 MG) BY MOUTH AT BEDTIME 30 tablet 5  . UNABLE TO FIND Take 1 capsule by mouth daily. Essential Amino Acids      No facility-administered medications prior to visit.  also on Tecfidera 240 mg bid, imipramine 25 mg at night and Ritalin 20 mg once or twice a day  PAST MEDICAL HISTORY: Past Medical History:  Diagnosis Date  . Movement disorder   . Multiple sclerosis (HCC)   . Neuropathy   . Pneumothorax, spontaneous, tension    Left x 2  . Vision abnormalities     PAST SURGICAL HISTORY: Past  Surgical History:  Procedure Laterality Date  . PLEURAL SCARIFICATION     collasped lung     FAMILY HISTORY: Family History  Problem Relation Age of Onset  . Hypertension Mother   . Hyperlipidemia Mother   . Colon cancer Mother   . COPD Father   . Multiple sclerosis Sister     SOCIAL HISTORY:  Social History   Socioeconomic History  . Marital status: Married    Spouse name: Not on file  . Number of children: Not on file  . Years of education: Not on file  . Highest education level: Not on file  Occupational History  . Not on file  Tobacco Use  . Smoking status: Former Smoker    Types: Cigarettes  . Smokeless tobacco: Never Used  Substance and Sexual  Activity  . Alcohol use: No    Alcohol/week: 0.0 standard drinks  . Drug use: No  . Sexual activity: Not on file  Other Topics Concern  . Not on file  Social History Narrative  . Not on file   Social Determinants of Health   Financial Resource Strain:   . Difficulty of Paying Living Expenses:   Food Insecurity:   . Worried About Charity fundraiser in the Last Year:   . Arboriculturist in the Last Year:   Transportation Needs:   . Film/video editor (Medical):   Marland Kitchen Lack of Transportation (Non-Medical):   Physical Activity:   . Days of Exercise per Week:   . Minutes of Exercise per Session:   Stress:   . Feeling of Stress :   Social Connections:   . Frequency of Communication with Friends and Family:   . Frequency of Social Gatherings with Friends and Family:   . Attends Religious Services:   . Active Member of Clubs or Organizations:   . Attends Archivist Meetings:   Marland Kitchen Marital Status:   Intimate Partner Violence:   . Fear of Current or Ex-Partner:   . Emotionally Abused:   Marland Kitchen Physically Abused:   . Sexually Abused:      PHYSICAL EXAM  Vitals:   03/29/20 1603  BP: 93/67  Pulse: 85  Temp: 98.2 F (36.8 C)  Weight: 124 lb 8 oz (56.5 kg)  Height: 5\' 5"  (1.651 m)   Pulse rechecked  and was 116.  Body mass index is 20.72 kg/m.   General: The patient is well-developed and well-nourished and in no acute distress   Neurologic Exam  Mental status: The patient is alert and oriented x 3 at the time of the examination. The patient has apparent normal recent and remote memory, with an apparently normal attention span and concentration ability.   Speech is normal.  Cranial nerves: Extraocular movements are full.  Facial strength s normal.. No dysarthria is noted.   No obvious hearing deficits are noted.  Motor:  Muscle bulk is normal and tone is increased in the legs, right greater than left..   There is minimal right arm increased tone though strength was good in the arms.  Strength was poor in  Legs 2+ to 3/5 on right and 4-/5 on the left  Sensory: Sensory testing is intact to touch and vibration sensation in all 4 extremities.  Coordination: Cerebellar testing reveals good finger-nose-finger on the left but mildly reduced rapid alternating movements and FTN in right hand.   Heel-to-shin was reduced on the left and she cannot do the right  Gait and station: Station is unstable.   She can take some steps with unilateral support but has a severe right foot drop.  She does better with bilateral support.  Reflexes: Deep tendon reflexes are increased, right > left.  Spread at the knees but no clonus    DIAGNOSTIC DATA (LABS, IMAGING, TESTING) - I reviewed patient records, labs, notes, testing and imaging myself where available.  Lab Results  Component Value Date   WBC 7.2 09/24/2019   HGB 12.4 09/24/2019   HCT 36.7 09/24/2019   MCV 86 09/24/2019   PLT 295 09/24/2019       ASSESSMENT AND PLAN  High risk medication use  Multiple sclerosis ([place]) - Plan: MR BRAIN W WO CONTRAST, MR CERVICAL SPINE WO CONTRAST, MR THORACIC SPINE WO CONTRAST  Abnormal gait  Lumbar spondylosis  Spondylolisthesis of lumbosacral region  Urinary dysfunction  1.   Continue REMS  program for Lemtrada.  We need to check MRIs to see if subclinical progression.  If present consider a third year of Lemtrada or an S1PR modulator.   We discussed [place] trial results 2.   Her low back pain is likely coming from the degenerative changes at L4-L5 and L5-S1.  If not better, she might benefit from medial branch blocks and radiofrequency ablation.  Since she has anterolisthesis at L5-S1 may need to just do the L4-L5 level only. 3.   Return in 6 months or sooner if there are new or worsening neurologic symptoms.  55-minute office visit with the majority of the time spent face-to-face for history and physical, discussion/counseling and decision-making.  Additional time with record review and documentation.  Suresh Audi A. Epimenio Foot, MD, PhD 03/29/2020, 7:56 PM Certified in Neurology, Clinical Neurophysiology, Sleep Medicine, Pain Medicine and Neuroimaging  [facility] [address], [address] [place], [place] [address] [phone]

## 2020-03-30 ENCOUNTER — Telehealth: Payer: Self-pay | Admitting: Neurology

## 2020-03-30 NOTE — Telephone Encounter (Signed)
[employer] medicare order sent to GI. They will obtain the auth and reach out to the patient to schedule.  °

## 2020-04-04 ENCOUNTER — Telehealth: Payer: Self-pay | Admitting: Neurology

## 2020-04-04 ENCOUNTER — Other Ambulatory Visit: Payer: Self-pay | Admitting: *Deleted

## 2020-04-04 ENCOUNTER — Other Ambulatory Visit: Payer: Self-pay | Admitting: Neurology

## 2020-04-04 MED ORDER — IMIPRAMINE HCL 25 MG PO TABS: ORAL_TABLET | ORAL | 3 refills | Status: DC

## 2020-04-04 MED ORDER — DIAZEPAM 5 MG PO TABS: ORAL_TABLET | ORAL | 5 refills | Status: DC

## 2020-04-04 MED ORDER — ROPINIROLE HCL 0.5 MG PO TABS: ORAL_TABLET | ORAL | 3 refills | Status: DC

## 2020-04-04 MED ORDER — OXYBUTYNIN CHLORIDE ER 15 MG PO TB24: 15.0000 mg | ORAL_TABLET | Freq: Every day | ORAL | 3 refills | Status: DC

## 2020-04-04 NOTE — Telephone Encounter (Signed)
Called [id number] - [place], [place] - [address]  Phone:  [phone]   Cx any remaining refills for requip, diazepam, imipramine, and oxybutynin. Advised pt changing pharmacy.    E-scribed refills for requip, imipramine, and ox[facility]butynin to [facility]. Sent rx diazepam to MD to e-scribe.

## 2020-04-04 NOTE — Progress Notes (Signed)
Called [id number] - [place], [place] - [address]  Phone:  [phone]   Cx any remaining refills for requip, diazepam, imipramine, and oxybutynin. Advised pt changing pharmacy.    E-scribed refills for requip, imipramine, and [facility]. Sent rx diazepam to MD to e-scribe.  

## 2020-04-04 NOTE — Telephone Encounter (Signed)
1) Medication(s) Requested (by name): rOPINIRole (REQUIP) 0.5 MG tablet  diazepam (VALIUM) 5 MG tablet  imipramine (TOFRANIL) 25 MG tablet oxybutynin (DITROPAN XL) 15 MG 24 hr tablet  2) [facility] [address] 3) Special Requests:  Switched pharmacy's needs medications sent to new pharmacy

## 2020-04-26 ENCOUNTER — Telehealth: Payer: Self-pay | Admitting: Neurology

## 2020-05-05 ENCOUNTER — Telehealth: Payer: Self-pay | Admitting: *Deleted

## 2020-05-05 NOTE — Telephone Encounter (Signed)
Pt had monthly lemtrada labs drawn 05/04/2020. Dr. Epimenio Foot out and Dr. Terrace Arabia reviewed in his absence.  Showing TSH of 5.730, previous one being 3.940. Per Dr. Terrace Arabia, pt should f/u with PCP or endocrinologist.  I called pt and relayed above info. She will f/u with PCP, Dr. Theophilus Kinds at [facility].I faxed report to him at  [phone]. Received fax confirmation.   Pt/husband reports that she was placed on progesterone a couple months ago but does not feel it is beneficial. Wanting to know if Dr. Epimenio Foot recommends she continue this or not. Aware Dr. Epimenio Foot out until next week.

## 2020-05-06 ENCOUNTER — Telehealth: Payer: Self-pay | Admitting: Neurology

## 2020-05-06 DIAGNOSIS — R7989 Other specified abnormal findings of blood chemistry: Secondary | ICD-10-CM

## 2020-05-06 NOTE — Telephone Encounter (Signed)
Pt has called asking Emma,RN for a referral request to [facility] ph# there is [phone] fax#[phone]

## 2020-05-09 NOTE — Addendum Note (Signed)
Addended by: Arther Abbott on: 05/09/2020 07:31 AM   Modules accepted: Orders

## 2020-05-09 NOTE — Telephone Encounter (Signed)
She should discuss progesteroe with the doctor who prescribed it.  It is not a medication neurology uses

## 2020-05-09 NOTE — Telephone Encounter (Signed)
Placed referral as requested.

## 2020-05-10 NOTE — Telephone Encounter (Signed)
Called pt back. Relayed Dr. Bonnita Hollow message. She verbalized understanding. She asked if referral was placed to endocrinology. Advised it was.

## 2020-05-23 ENCOUNTER — Other Ambulatory Visit: Payer: Self-pay | Admitting: Neurology

## 2020-05-24 ENCOUNTER — Ambulatory Visit
Admission: RE | Admit: 2020-05-24 | Discharge: 2020-05-24 | Disposition: A | Discharge status: Home / Self Care | Payer: Medicare Other | Source: Ambulatory Visit | Attending: Neurology | Admitting: Neurology

## 2020-05-24 ENCOUNTER — Other Ambulatory Visit: Payer: Self-pay

## 2020-05-24 DIAGNOSIS — G35 Multiple sclerosis: Secondary | ICD-10-CM | POA: Diagnosis not present

## 2020-05-24 MED ORDER — GADOBENATE DIMEGLUMINE 529 MG/ML IV SOLN
12.0000 mL | Freq: Once | INTRAVENOUS | Status: AC | PRN
  Administered 2020-05-24: 12 mL via INTRAVENOUS

## 2020-05-31 NOTE — Telephone Encounter (Signed)
Pt has called again asking Emma,RN for a referral request to [facility] ph# there is [phone] fax#[phone]

## 2020-06-01 NOTE — Telephone Encounter (Signed)
Had to Resend referral telephone [phone] - fax [phone] . Called and gave patient correct information.

## 2020-06-07 ENCOUNTER — Encounter: Payer: Self-pay | Admitting: Neurology

## 2020-06-29 ENCOUNTER — Telehealth: Payer: Self-pay | Admitting: Neurology

## 2020-06-29 NOTE — Telephone Encounter (Signed)
Please let her know that I took another look at the MRI and compared it to the previous one.  The amount of atrophy has not changed from 2019 - 20 21.

## 2020-06-29 NOTE — Telephone Encounter (Signed)
Pt is asking for a call with the results to her MRI 

## 2020-06-29 NOTE — Telephone Encounter (Signed)
She should try Zantac or Pepcid (ranitidine or famotidine) .  for either 1, the dose would be 2 over-the-counter pills a day  If this does not help her heartburn enough we could reconsider going back on omeprazole.

## 2020-06-29 NOTE — Telephone Encounter (Addendum)
Reviewed patient chart. Dr. Epimenio Foot placed this on a result note 05/25/2020: "I took a look at the MRI of the brain, cervical spine and thoracic spine. There did not appear to be any new lesions." I relayed this to the pt. She verbalized understanding.   However, she wants to know if her MRI brain showed any significant atrophy/worsening atrophy? She also wants Dr. Bonnita Hollow opinion on her taking omeprazole for heart burn/acid reflux. She recently stopped taking this because she has read it can cause brittle bones and with her hx of falls, she is concerned about falling/breaking something. Wondering if Dr. Epimenio Foot feels it is safe for her to take or if there is another medication he would recommend instead.

## 2020-06-29 NOTE — Addendum Note (Signed)
Addended by: Arther Abbott on: 06/29/2020 05:29 PM   Modules accepted: Orders

## 2020-06-29 NOTE — Telephone Encounter (Signed)
Dr. Epimenio Foot- she also had the question about omeprazole. Please advise

## 2020-06-29 NOTE — Telephone Encounter (Signed)
Called pt. Relayed per Dr. Epimenio Foot that he compared MRI's and no change in atrophy. She will try pepcid OTC, 2 pills per day instead of omeprazole. Nothing further needed. I updated her med list.

## 2020-07-04 NOTE — Telephone Encounter (Signed)
Pt at [facility] now for appt. I took call from phone staff and spoke with Maureen Ralphs. States the lab report they received was from 03/02/20. Needing sooner lab results. I faxed labs collected from monthly lemtrada collection on 06/07/20 to them at [phone]. Received fax confirmation.

## 2020-07-30 IMAGING — MR MR LUMBAR SPINE WO/W CM
4 of 7 series · 24 of 48 positions shown · IV contrast (gadavist)
Comparison: 04/17/2019

CLINICAL DATA: Back pain and fecal incontinence

EXAM:
MRI LUMBAR SPINE WITHOUT AND WITH CONTRAST
TECHNIQUE: Multiplanar and multiecho pulse sequences of the lumbar spine were
obtained without and with intravenous contrast.
CONTRAST:  6mL GADAVIST GADOBUTROL 1 MMOL/ML IV SOLN

[Series 5: T2 · sagittal · 4.0mm · 0.73mm/px · 3 of 13 slices shown (1 of 2)]
[im 1/13]
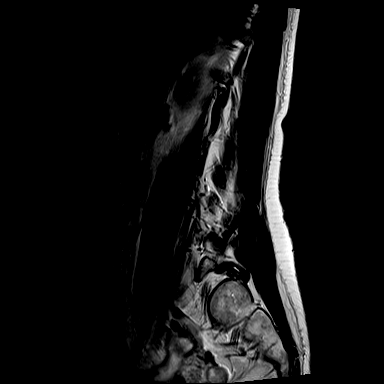
[im 7/13]
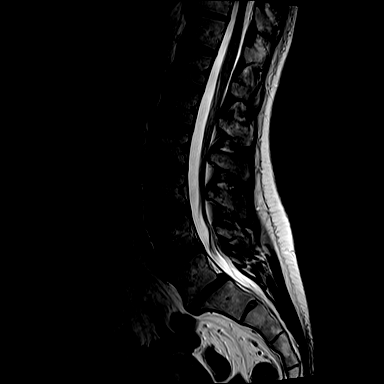
[im 13/13]
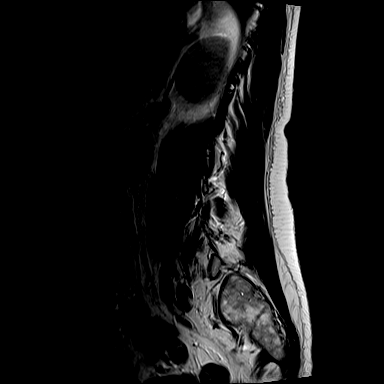

[Series 7: T1 · sagittal · 4.0mm · 0.88mm/px · 4 of 13 slices shown (1 of 2)]
[im 1/13]
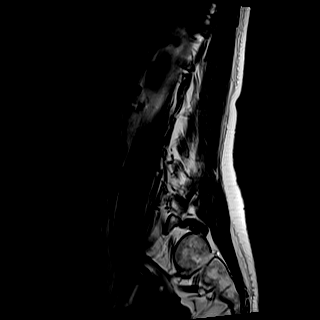
[im 5/13]
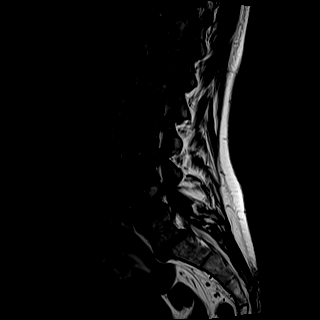
[im 9/13]
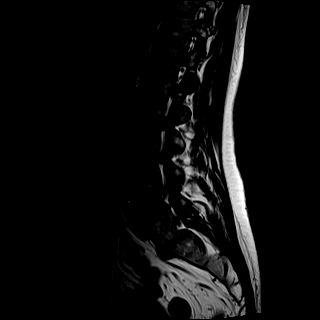
[im 13/13]
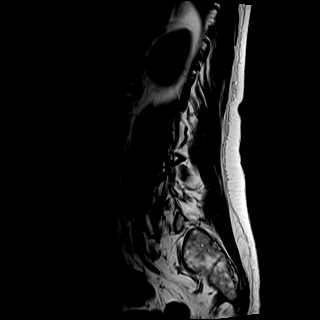

[Series 8: T2 · axial · 4.0mm · 0.57mm/px · z∈[-125,+104]mm · 11 of 39 slices shown (2 of 2)]
[im 1/39]
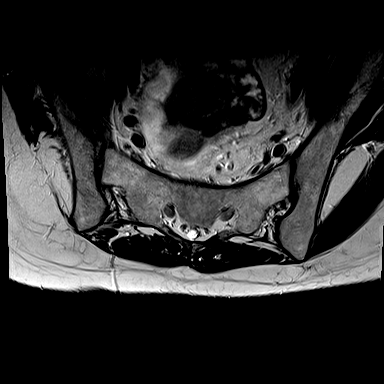
[im 4/39]
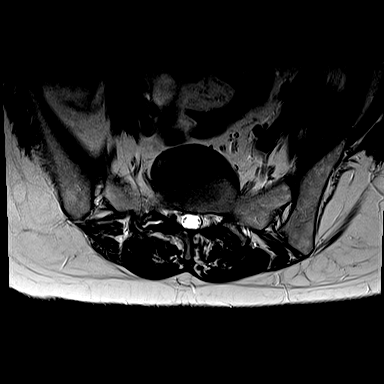
[im 8/39]
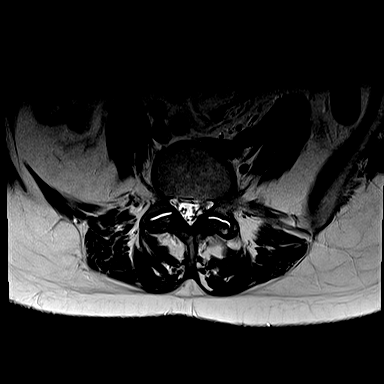
[im 12/39]
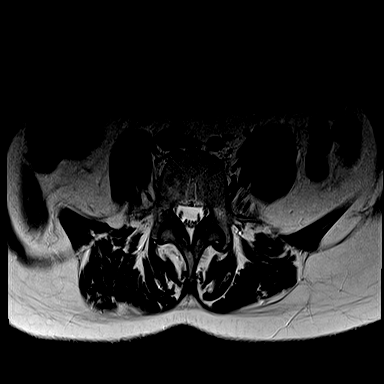
[im 16/39]
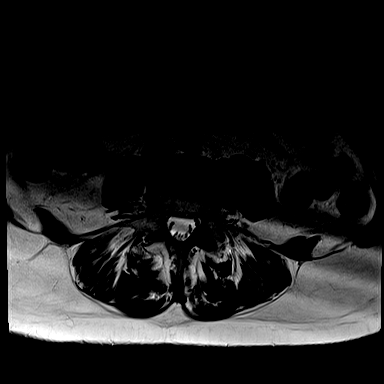
[im 20/39]
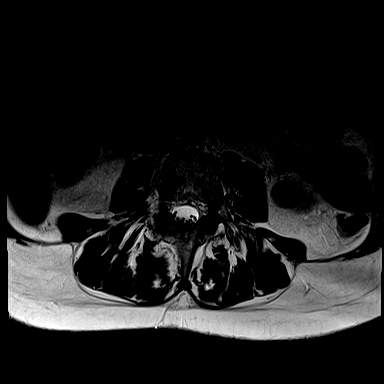
[im 23/39]
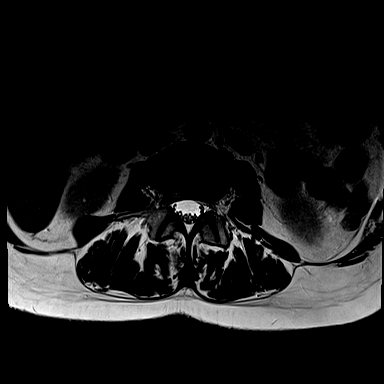
[im 27/39]
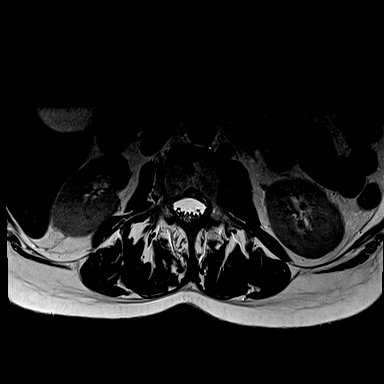
[im 31/39]
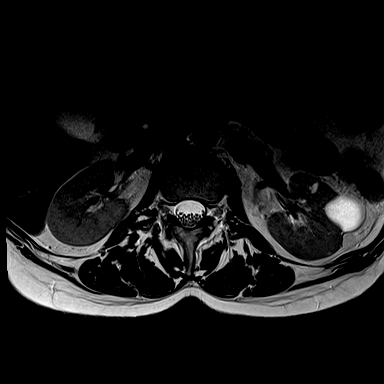
[im 35/39]
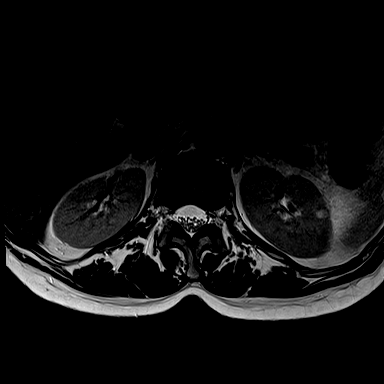
[im 39/39]
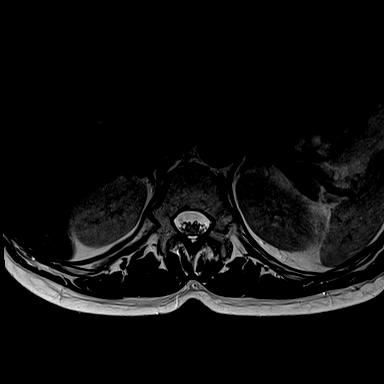

[Series 9: T1 · axial · 4.0mm · 0.34mm/px · z∈[-125,+84]mm · 6 of 39 slices shown (2 of 2)]
[im 1/39]
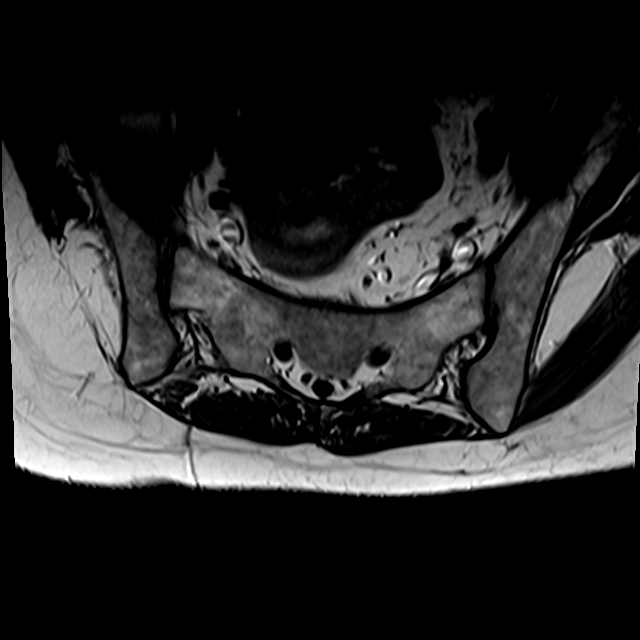
[im 4/39]
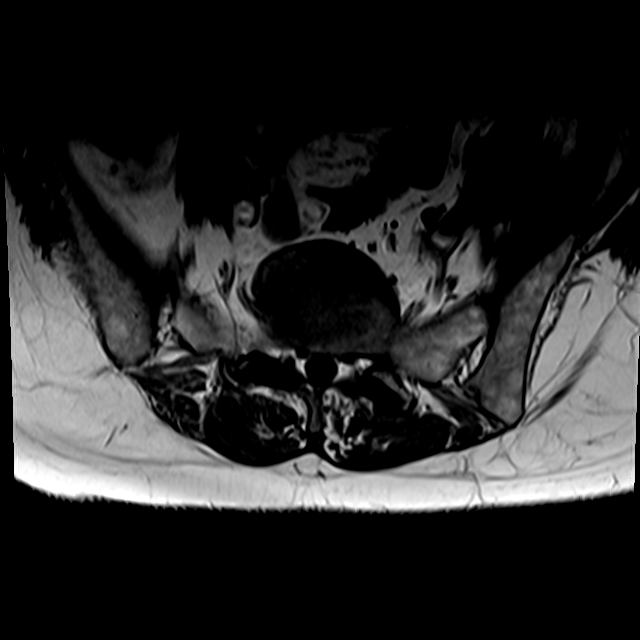
[im 8/39]
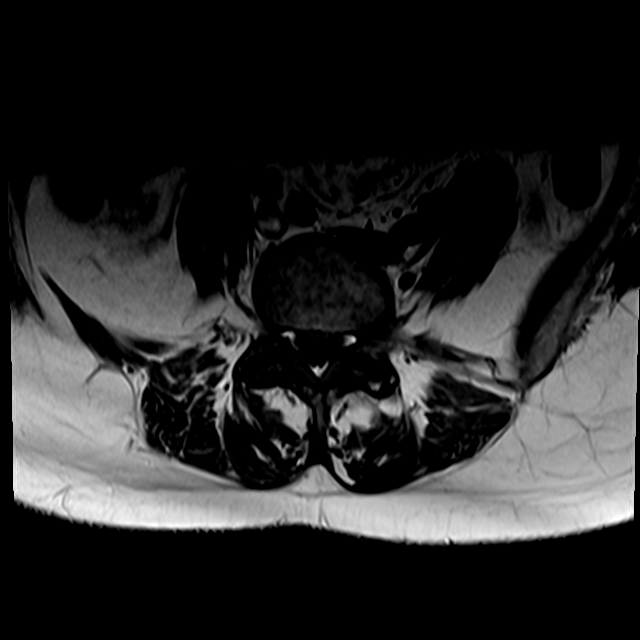
[im 12/39]
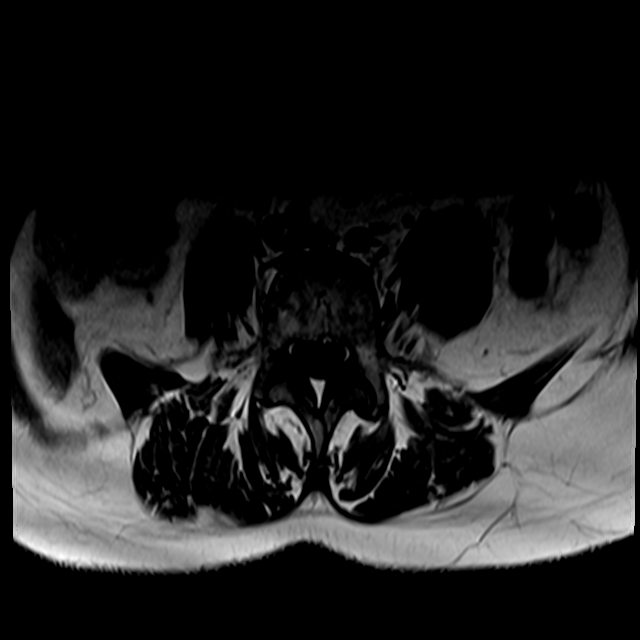
[im 20/39]
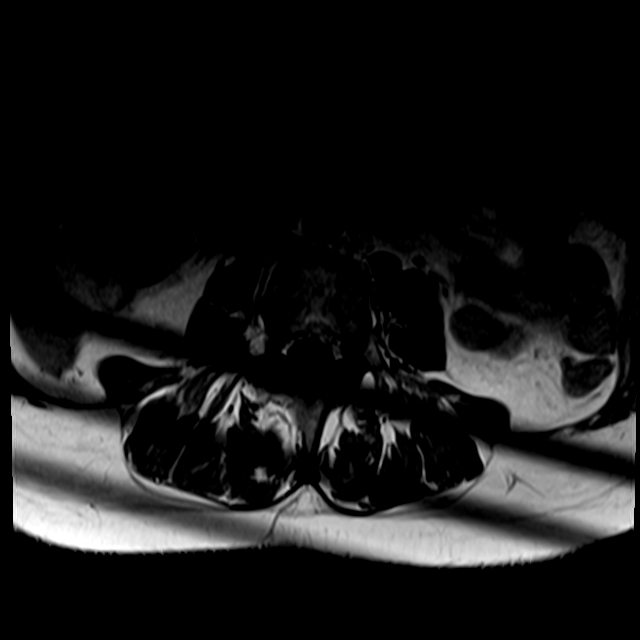
[im 35/39]
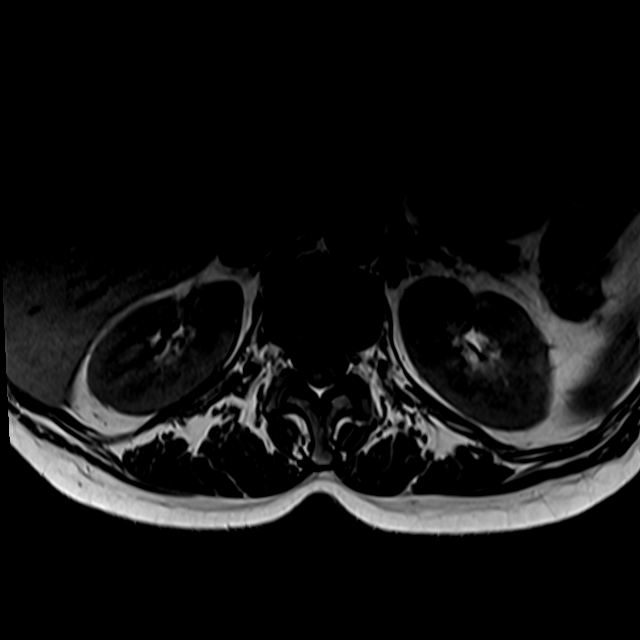

[24 of 48 positions shown; findings below may reference images not displayed]

FINDINGS: Segmentation:  Normal

Alignment:  Grade 1 anterolisthesis at L5-S1

Vertebrae:  No fracture, evidence of discitis, or bone lesion.

Conus medullaris and cauda equina: Conus extends to the L1 level.
Conus and cauda equina appear normal.

Paraspinal and other soft tissues: Negative

Disc levels:

Sagittal imaging of T10-11 and T11-12 is normal.

T12-L1: Normal disc space and facets. No spinal canal or
neuroforaminal stenosis.

L1-L2: Normal disc space and facets. No spinal canal or
neuroforaminal stenosis.

L2-L3: Normal disc space and facets. No spinal canal or
neuroforaminal stenosis.

L3-L4: Normal disc space and facets. No spinal canal or
neuroforaminal stenosis.

L4-L5: Disc desiccation with moderate facet hypertrophy, unchanged.
Small synovial cyst projecting medially from the left facet joint is
also unchanged. This exerts mild mass effect on the descending
left-sided nerve roots. There is no central spinal canal stenosis.
Mild bilateral foraminal narrowing is unchanged.

L5-S1: Grade 1 anterolisthesis secondary to severe facet arthrosis
is unchanged. No spinal canal stenosis or neural foraminal stenosis.

Visualized sacrum: Normal.
IMPRESSION: 1. No cauda equina compression.
2. Unchanged severe facet arthrosis at L4-5 and L5-S1 with resultant
grade 1 anterolisthesis at the lower level.
3. Mass effect on the descending left L5 nerve root secondary to
synovial cyst arising from the facet joint at L4-5, unchanged.

## 2020-08-04 ENCOUNTER — Encounter: Payer: Self-pay | Admitting: Neurology

## 2020-09-28 ENCOUNTER — Encounter: Payer: Self-pay | Admitting: Neurology

## 2020-09-28 ENCOUNTER — Ambulatory Visit: Payer: Medicare HMO | Admitting: Neurology

## 2020-09-28 VITALS — BP 101/71 | HR 86 | Ht 65.0 in | Wt 124.5 lb

## 2020-09-28 DIAGNOSIS — G35 Multiple sclerosis: Secondary | ICD-10-CM

## 2020-09-28 DIAGNOSIS — R5383 Other fatigue: Secondary | ICD-10-CM

## 2020-09-28 DIAGNOSIS — Z79899 Other long term (current) drug therapy: Secondary | ICD-10-CM

## 2020-09-28 DIAGNOSIS — R269 Unspecified abnormalities of gait and mobility: Secondary | ICD-10-CM

## 2020-09-28 DIAGNOSIS — M4807 Spinal stenosis, lumbosacral region: Secondary | ICD-10-CM

## 2020-09-28 DIAGNOSIS — M4317 Spondylolisthesis, lumbosacral region: Secondary | ICD-10-CM

## 2020-09-28 DIAGNOSIS — R39198 Other difficulties with micturition: Secondary | ICD-10-CM

## 2020-09-28 DIAGNOSIS — M47816 Spondylosis without myelopathy or radiculopathy, lumbar region: Secondary | ICD-10-CM

## 2020-09-28 MED ORDER — PREGABALIN 75 MG PO CAPS: 75.0000 mg | ORAL_CAPSULE | Freq: Two times a day (BID) | ORAL | 5 refills | Status: DC

## 2020-09-28 MED ORDER — DIAZEPAM 5 MG PO TABS
ORAL_TABLET | ORAL | 5 refills | Status: DC
Start: 2020-09-28 — End: 2021-03-29

## 2020-09-28 NOTE — Progress Notes (Signed)
[facility]  PATIENT: Katherine Cameron DOB: 1971/09/09  REFERRING CLINICIAN: Hedgecock, PA-C HISTORY FROM: Patient with input form husband  REASON FOR VISIT: MS and spastic gait   HISTORICAL  CHIEF COMPLAINT:  Chief Complaint  Patient presents with   Follow-up    RM 12 with husband. Last seen 03/29/20. She has noticed core gets very hot, she is pre-menopausel. Having more trouble with walking (getting more out of breathe with shorter distances/has to sit and rest). Has fallen many times d.t this. Uses walker. Falling at least 2-3 times per week. Balance worse. Getting vertigo more. Last thursday she fell and landed on her butt, hurt her back. Has been hurting since.    Multiple Sclerosis    On Lemtrada. Year 1 infusion: 03/11/17-03/15/2017. Yr 2: 03/31/2018 &04/01/2018, 04/02/2018   Gait Problem    Uses electronic scooter    HISTORY OF PRESENT ILLNESS:  Katherine Cameron is a 49 year old woman with active secondary progressive MS    Update 09/28/2020: She had the trwo years of [place] in 2018 and April 2019.  She is compliant with the REMS program  She has not had a definite exacerbation since but has noted mild progression.     She is always feeing hot but is perimenopausal.    She is also feeling weaker in general.  She uses a scooter outside of the house and inside the house usually uses a walker.  Besides the weakness in the legs, right greater than left, she also notes reduced fine movements in the right hand.  She had a fall on Thursday and has felt weaker since.  She has no left swelling or bruising but she is having more pian in the left leg.     She is now having falls almost daily.   She used to be able to get herself up but now can't.   Her leg spasms are also getting worse, especially the right leg and left toes.  She has more LBP and has L4L5 and L5S1 DJD with facet hypertrophy and L5S1 anterolisthesis.  At L4-L5 she has a synovial cyst on the left with  lateral recess stenosis that could affect the left L5 nerve root.  CBD was helping for a while.  She saw a spine surgeon (Dr. Precious Gilding) and was advised to wait/monitor.   She leans forward while walking.   She sleeps in a fetal position.   Opiates cause constipation and she does not take much.   Lyrica has helped her some in the past.    Amantadine has helped fatigue.  She notes more cognitive issues.  To help with the symptoms of MS, she uses marijuana frequently.  She has had the JNJ vaccination.    She has reduced focus/attention.  Provigil did not help much.   Ritalin helped some but caused tachycardia.    MS History:  She presented with right optic neuritis. She went to an ophthalmologist and then was referred to neurology where she had an MRI of the brain performed. The MRI was consistent with MS. She did not need to have a lumbar puncture. This was before FDA approved medications and she had several courses of IV steroids. Around 1994, she started Avonex after a large exacerbation around 2008, she went on Tysabri. She was more stable on Tysabri. Unfortunately, she had a a high titer JCV antibody. She was on Tysabri for about 3 years and then was switched to Tecfidera. She has been on Tecfidera for about 3  years. During the transition from [place] to [place], she had an especially severe exacerbation with significant worsening of her gait. A new focus was noted in the cervical spine.   She switched to [place] in 2018 and had her second year April 2019.  She is compliant with the rems program   Imaging: MRI of the brain 05/24/2020 shows multiple T2/FLAIR hyperintense foci in the hemispheres, cerebellum, thalamus and brainstem in a pattern and configuration consistent with chronic demyelinating plaque associated with multiple sclerosis.  None of the foci enhance or appear to be acute.  Compared to the MRI dated 04/17/2019, there are no new lesions.   There is a normal enhancement pattern.  No acute  findings.  MRI of the thoracic spine 05/24/2020 shows T2 hyperintense signal within the spinal cord consistent with chronic demyelinating plaque associated with multiple sclerosis.   No significant disc degenerative changes and spondylosis is noted.  There is no nerve root compression or spinal stenosis.  MRI of the cervical spine 05/24/2020 shows T2 hyperintense signal changes noted throughout the cervical spinal cord.  This appears unchanged compared to the 04/17/2019 MRI.  Additional foci are noted in the cerebellum and brainstem.Marland Kitchen   Spinal cord atrophy is noted, also unchanged.   Multilevel degenerative changes as detailed above that do not lead to nerve root compression or spinal stenosis.  MRI of the lumbar spine 08/15/2019 shows severe facet arthrosis at L4-5 and L5-S1 with resultant grade 1 anterolisthesis at the lower level. Mass effect on the descending left L5 nerve root secondary to synovial cyst arising from the facet joint at L4-5, unchanged.  REVIEW OF SYSTEMS:  Constitutional: No fevers, chills, sweats, or change in appetite.  She has insomnia Eyes: No visual changes, double vision, eye pain Ear, nose and throat: No hearing loss, ear pain, nasal congestion, sore throat Cardiovascular: No chest pain, palpitations Respiratory:  No shortness of breath at rest or with exertion.   No wheezes GastrointestinaI: No nausea, vomiting, diarrhea, abdominal pain, fecal incontinence Genitourinary:  No dysuria, urinary retention or frequency.  No nocturia. Musculoskeletal:  No neck pain,She has left > right hip pain.  Lower back pain Integumentary: No rash, pruritus, skin lesions Neurological: as above Psychiatric: No depression at this time.  No anxiety Endocrine: No palpitations, diaphoresis, change in appetite, change in weigh or increased thirst Hematologic/Lymphatic:  No anemia, purpura, petechiae. Allergic/Immunologic: No itchy/runny eyes, nasal congestion, recent allergic reactions,  rashes  ALLERGIES: No Known Allergies  HOME MEDICATIONS: Outpatient Medications Prior to Visit  Medication Sig Dispense Refill   acetaminophen (TYLENOL) 500 MG tablet Take 2 tablets (1,000 mg total) by mouth every 8 (eight) hours as needed for moderate pain. 90 tablet 5   Alemtuzumab (LEMTRADA) 12 MG/1.2ML SOLN Inject 12 mg into the vein.     Alpha Lipoic Acid 200 MG CAPS Take 2 tablets daily 60 capsule 11   amantadine (SYMMETREL) 100 MG capsule TAKE 1 CAPSULE(100 MG) BY MOUTH TWICE DAILY 60 capsule 1   Cholecalciferol (VITAMIN D3) 5000 UNITS CAPS Take 1 capsule (5,000 Units total) by mouth daily. 90 capsule 3   co-enzyme Q-10 50 MG capsule Take 50 mg by mouth daily.     EVENING PRIMROSE OIL PO Take 1 Dose by mouth daily.      imipramine (TOFRANIL) 25 MG tablet TAKE 1 TO 2 TABLETS(25 TO 50 MG) BY MOUTH AT BEDTIME 180 tablet 3   metoprolol tartrate (LOPRESSOR) 50 MG tablet Take 50 mg by mouth daily. Takes 2-25mg   every morning     Omeprazole (PRILOSEC PO) Take by mouth.     oxybutynin (DITROPAN XL) 15 MG 24 hr tablet Take 1 tablet (15 mg total) by mouth at bedtime. 90 tablet 3   oxyCODONE-acetaminophen (PERCOCET/ROXICET) 5-325 MG tablet Take 1 tablet by mouth 2 (two) times daily. 60 tablet 0   rOPINIRole (REQUIP) 0.5 MG tablet TAKE 1 TABLET(0.5 MG) BY MOUTH AT BEDTIME 90 tablet 3   UNABLE TO FIND Take 1 capsule by mouth daily. Essential Amino Acids      diazepam (VALIUM) 5 MG tablet TAKE 1 TABLET(5 MG) BY MOUTH AT BEDTIME AS NEEDED FOR ANXIETY 30 tablet 5   Famotidine (PEPCID PO) Take 2 tablets by mouth daily.     LINZESS 290 MCG CAPS capsule Take 290 mcg by mouth daily.     progesterone (PROMETRIUM) 200 MG capsule Take 200 mg by mouth at bedtime.     No facility-administered medications prior to visit.  also on Tecfidera 240 mg bid, imipramine 25 mg at night and Ritalin 20 mg once or twice a day  PAST MEDICAL HISTORY: Past Medical History:  Diagnosis Date   Movement  disorder    Multiple sclerosis (HCC)    Neuropathy    Pneumothorax, spontaneous, tension    Left x 2   Vision abnormalities     PAST SURGICAL HISTORY: Past Surgical History:  Procedure Laterality Date   PLEURAL SCARIFICATION     collasped lung     FAMILY HISTORY: Family History  Problem Relation Age of Onset   Hypertension Mother    Hyperlipidemia Mother    Colon cancer Mother    COPD Father    Multiple sclerosis Sister     SOCIAL HISTORY:  Social History   Socioeconomic History   Marital status: Married    Spouse name: Not on file   Number of children: Not on file   Years of education: Not on file   Highest education level: Not on file  Occupational History   Not on file  Tobacco Use   Smoking status: Former Smoker    Types: Cigarettes   Smokeless tobacco: Never Used  Building services engineer Use: Never used  Substance and Sexual Activity   Alcohol use: No    Alcohol/week: 0.0 standard drinks   Drug use: No   Sexual activity: Not on file  Other Topics Concern   Not on file  Social History Narrative   Not on file   Social Determinants of Health   Financial Resource Strain:    Difficulty of Paying Living Expenses: Not on file  Food Insecurity:    Worried About Programme researcher, broadcasting/film/video in the Last Year: Not on file   [employer] of Food in the Last Year: Not on file  Transportation Needs:    Lack of Transportation (Medical): Not on file   Lack of Transportation (Non-Medical): Not on file  Physical Activity:    Days of Exercise per Week: Not on file   Minutes of Exercise per Session: Not on file  Stress:    Feeling of Stress : Not on file  Social Connections:    Frequency of Communication with Friends and Family: Not on file   Frequency of Social Gatherings with Friends and Family: Not on file   Attends Religious Services: Not on file   Active Member of Clubs or Organizations: Not on file   Attends Banker  Meetings: Not on file   Marital Status:  Not on file  Intimate Partner Violence:    Fear of Current or Ex-Partner: Not on file   Emotionally Abused: Not on file   Physically Abused: Not on file   Sexually Abused: Not on file     PHYSICAL EXAM  Vitals:   09/28/20 1431  BP: 101/71  Pulse: 86  Weight: 124 lb 8 oz (56.5 kg)  Height: 5\' 5"  (1.651 m)   Pulse rechecked and was 116.  Body mass index is 20.72 kg/m.   General: The patient is well-developed and well-nourished and in no acute distress   Neurologic Exam  Mental status: The patient is alert and oriented x 3 at the time of the examination. The patient has apparent normal recent and remote memory, with an apparently normal attention span and concentration ability.   Speech is normal.  Cranial nerves: Extraocular movements are full.  Facial strength s normal.. No dysarthria is noted.   No obvious hearing deficits are noted.  Motor:  Muscle bulk is normal and tone is increased in the legs, right greater than left..   There is minimal right arm increased tone though strength was good in the arms.  Strength was poor in  Legs 2+ to 3/5 on right and 4-/5 on the left  Sensory: Sensory testing is intact to touch and vibration sensation in all 4 extremities.  Coordination: Cerebellar testing reveals good finger-nose-finger on the left but mildly reduced rapid alternating movements and FTN in right hand.   Heel-to-shin was reduced on the left and she cannot do the right  Gait and station: Station is unstable.   She needs support to take more than one step.  She has a right foot drop.  T.  Reflexes: Deep tendon reflexes are increased, right > left.  She has increased reflexes at the knees.  No ankle clonus    DIAGNOSTIC DATA (LABS, IMAGING, TESTING) - I reviewed patient records, labs, notes, testing and imaging myself where available.  Lab Results  Component Value Date   WBC 7.2 09/24/2019   HGB 12.4 09/24/2019   HCT  36.7 09/24/2019   MCV 86 09/24/2019   PLT 295 09/24/2019       ASSESSMENT AND PLAN  Multiple sclerosis (HCC)  Spinal stenosis of lumbosacral region - Plan: MR LUMBAR SPINE WO CONTRAST  High risk medication use  Abnormal gait  Lumbar spondylosis  Spondylolisthesis of lumbosacral region  Urinary dysfunction  Other fatigue  1.   Continue REMS program for Lemtrada.   2.   Her low back pain is likely coming from the degenerative changes at L4-L5 and L5-S1.  Her pain is worsening and I am concerned that she might have progressed further.  We will check an MRI of the lumbar spine and have her evaluated by surgery depending the results.  She also may benefit from an epidural steroid on the left L4-L5  3.   Lyrica for radicular pain  4.   return in 6 months or sooner if there are new or worsening neurologic symptoms.  45-minute office visit with the majority of the time spent face-to-face for history and physical, discussion/counseling and decision-making.  Additional time with record review and documentation.  Anubis Fundora A. Epimenio Foot, MD, PhD 09/28/2020, 6:13 PM Certified in Neurology, Clinical Neurophysiology, Sleep Medicine, Pain Medicine and Neuroimaging  [facility] [address] [place], [place] [address] [phone]

## 2020-09-29 ENCOUNTER — Telehealth: Payer: Self-pay | Admitting: *Deleted

## 2020-09-29 ENCOUNTER — Telehealth: Payer: Self-pay | Admitting: Neurology

## 2020-09-29 NOTE — Telephone Encounter (Signed)
[employer] medicare order sent to GI. They will obtain the auth and reach out to the patient to schedule.  °

## 2020-09-29 NOTE — Telephone Encounter (Signed)
Submitted PA Lyrica 75mg  cap on CMM. . Received instant approval via [employer] effective  03/03/2020 - 12/02/2020.  Member ID Number: [id number]

## 2020-09-29 NOTE — Telephone Encounter (Signed)
Faxed copy of Lemtrada labs from 08/04/20 and 09/06/20 to Dr. Joellyn Quails per pt request at [phone]. Received fax confirmation.

## 2020-10-09 ENCOUNTER — Ambulatory Visit
Admission: RE | Admit: 2020-10-09 | Discharge: 2020-10-09 | Disposition: A | Discharge status: Home / Self Care | Payer: Medicare HMO | Source: Ambulatory Visit | Attending: Neurology | Admitting: Neurology

## 2020-10-09 ENCOUNTER — Other Ambulatory Visit: Payer: Self-pay

## 2020-10-09 DIAGNOSIS — M4807 Spinal stenosis, lumbosacral region: Secondary | ICD-10-CM

## 2020-10-10 ENCOUNTER — Telehealth: Payer: Self-pay | Admitting: *Deleted

## 2020-10-10 ENCOUNTER — Other Ambulatory Visit: Payer: Self-pay | Admitting: *Deleted

## 2020-10-10 DIAGNOSIS — M7138 Other bursal cyst, other site: Secondary | ICD-10-CM

## 2020-10-10 DIAGNOSIS — M5416 Radiculopathy, lumbar region: Secondary | ICD-10-CM

## 2020-10-10 NOTE — Telephone Encounter (Signed)
-----   Message from [name], MD sent at 10/09/2020  8:31 PM EST ----- Please let her know that the lumbar spine shows that the synovial cyst at the L4-L5 level is a little bit thicker on the current MRI than it was last year and it is putting more pressure on the left L5 nerve root.  I think it would be reasonable for her to get a neurosurgery consultation

## 2020-10-11 ENCOUNTER — Telehealth: Payer: Self-pay | Admitting: Neurology

## 2020-10-11 NOTE — Telephone Encounter (Signed)
Called and spoke to patient and her husband and she has to register her self . Patient will call me back when she does that and I will send Records to .another opinion from Dr. Lorelle Gibbs at [facility] in [place] . Patient has my contact information .

## 2020-10-22 ENCOUNTER — Telehealth: Payer: Self-pay | Admitting: Neurology

## 2020-10-22 NOTE — Telephone Encounter (Signed)
She saw Dr. Jake Samples at [facility] 10/17/2020. He feels majority of the pain is coming from the degenerative disc disease and mobile spondylolisthesis at L5-S1. She will be referred to Dr. Lorrine Kin, anesthesiology pain medicine.

## 2020-10-25 ENCOUNTER — Other Ambulatory Visit: Payer: Self-pay | Admitting: *Deleted

## 2020-10-25 MED ORDER — SHINGRIX 50 MCG/0.5ML IM SUSR: 0.5000 mL | Freq: Once | INTRAMUSCULAR | 1 refills | Status: AC

## 2020-10-26 ENCOUNTER — Other Ambulatory Visit: Payer: Self-pay | Admitting: *Deleted

## 2020-10-26 MED ORDER — AMANTADINE HCL 100 MG PO CAPS
ORAL_CAPSULE | ORAL | 5 refills | Status: DC
Start: 2020-10-26 — End: 2021-04-18

## 2020-12-05 ENCOUNTER — Other Ambulatory Visit: Payer: Self-pay | Admitting: Neurology

## 2020-12-05 ENCOUNTER — Encounter: Payer: Self-pay | Admitting: Neurology

## 2020-12-05 MED ORDER — BACLOFEN 10 MG PO TABS
ORAL_TABLET | ORAL | 11 refills | Status: DC
Start: 2020-12-05 — End: 2021-03-29

## 2020-12-29 ENCOUNTER — Encounter: Payer: Self-pay | Admitting: *Deleted

## 2021-01-17 DIAGNOSIS — Z0289 Encounter for other administrative examinations: Secondary | ICD-10-CM

## 2021-01-18 NOTE — Telephone Encounter (Signed)
Gave completed/signed form back to medical records to process for pt. 

## 2021-03-06 ENCOUNTER — Encounter: Payer: Self-pay | Admitting: Neurology

## 2021-03-29 ENCOUNTER — Encounter: Payer: Self-pay | Admitting: Neurology

## 2021-03-29 ENCOUNTER — Ambulatory Visit: Payer: Medicare HMO | Admitting: Neurology

## 2021-03-29 VITALS — BP 89/60 | HR 87 | Ht 65.0 in | Wt 124.0 lb

## 2021-03-29 DIAGNOSIS — M4317 Spondylolisthesis, lumbosacral region: Secondary | ICD-10-CM

## 2021-03-29 DIAGNOSIS — Z79899 Other long term (current) drug therapy: Secondary | ICD-10-CM

## 2021-03-29 DIAGNOSIS — R7989 Other specified abnormal findings of blood chemistry: Secondary | ICD-10-CM

## 2021-03-29 DIAGNOSIS — M7138 Other bursal cyst, other site: Secondary | ICD-10-CM

## 2021-03-29 DIAGNOSIS — G35 Multiple sclerosis: Secondary | ICD-10-CM

## 2021-03-29 DIAGNOSIS — M4807 Spinal stenosis, lumbosacral region: Secondary | ICD-10-CM | POA: Diagnosis not present

## 2021-03-29 DIAGNOSIS — R5383 Other fatigue: Secondary | ICD-10-CM

## 2021-03-29 DIAGNOSIS — R39198 Other difficulties with micturition: Secondary | ICD-10-CM

## 2021-03-29 DIAGNOSIS — G35D Multiple sclerosis, unspecified: Secondary | ICD-10-CM

## 2021-03-29 MED ORDER — DIAZEPAM 5 MG PO TABS: ORAL_TABLET | ORAL | 5 refills | Status: DC

## 2021-03-29 MED ORDER — TIZANIDINE HCL 4 MG PO TABS: 4.0000 mg | ORAL_TABLET | Freq: Three times a day (TID) | ORAL | 11 refills | Status: DC

## 2021-03-29 NOTE — Progress Notes (Signed)
[facility]  PATIENT: Katherine Cameron DOB: [birth year]  REFERRING CLINICIAN: Hedgecock, PA-C HISTORY FROM: Patient with input form husband  REASON FOR VISIT: MS and spastic gait   HISTORICAL  CHIEF COMPLAINT:  Chief Complaint  Patient presents with  . Follow-up    RM 13 w/ husband. Last seen 09/28/2020. MS DMT: Julaine Hua. Most revent fall today. She was bending over to get pill from ground. Denis any injuries.     HISTORY OF PRESENT ILLNESS:  Katherine Cameron is a 50 year old woman with active secondary progressive MS    Update 03/29/2021: She had the two years of [place] in 2018 and April 2019.  She is compliant with the REMS program and will be doing lab work until 2023.  She has not had a definite exacerbation since but has noted mild progression.   Labs ok except mild increased TSH  She feels she has done fairly well over the last couple months.  She is walking about the same.  She uses a scooter for longer distances but is able to get around the house for short distances using a walker.  She has weakness in both legs, right greater than left, she also notes reduced fine movements in the right hand.  She has had some falls.  Her leg spasms are also getting worse, especially the right leg and left toes.  She went camping in [place] 2 weeks ago with her husband and friends recently.   She unfortunately got noravirus on her way home.     She has more LBP and has L4L5 and L5S1 DJD with facet hypertrophy and L5S1 anterolisthesis.  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  CBD was helping for a while.  She saw a spine surgeon (Dr. Precious Gilding) and was advised to wait/monitor.   She leans forward while walking.   She sleeps in a fetal position.  Pain is actually down a bit better since the last visit.  Amantadine has helped fatigue.  She notes more cognitive issues.  To help with the symptoms of MS, she uses  marijuana frequently.  She is sleeping much better with combination of ropinirole and imipramine.        She has reduced focus/attention.  Provigil did not help much.   Ritalin helped some but caused tachycardia.    MS History:  She presented with right optic neuritis. She went to an ophthalmologist and then was referred to neurology where she had an MRI of the brain performed. The MRI was consistent with MS. She did not need to have a lumbar puncture. This was before FDA approved medications and she had several courses of IV steroids. Around 1994, she started Avonex after a large exacerbation around 2008, she went on Tysabri. She was more stable on Tysabri. Unfortunately, she had a a high titer JCV antibody. She was on Tysabri for about 3 years and then was switched to Tecfidera. She has been on Tecfidera for about 3 years. During the transition from Tysabri to [place], she had an especially severe exacerbation with significant worsening of her gait. A new focus was noted in the cervical spine.   She switched to [place] in 2018 and had her second year April 2019.  She is compliant with the rems program   Imaging: MRI of the brain 05/24/2020 shows multiple T2/FLAIR hyperintense foci in the hemispheres, cerebellum, thalamus and brainstem in a pattern and configuration  consistent with chronic demyelinating plaque associated with multiple sclerosis.  None of the foci enhance or appear to be acute.  Compared to the MRI dated 04/17/2019, there are no new lesions.   There is a normal enhancement pattern.  No acute findings.  MRI of the thoracic spine 05/24/2020 shows T2 hyperintense signal within the spinal cord consistent with chronic demyelinating plaque associated with multiple sclerosis.   No significant disc degenerative changes and spondylosis is noted.  There is no nerve root compression or spinal stenosis.  MRI of the cervical spine 05/24/2020 shows T2 hyperintense signal changes noted throughout the  cervical spinal cord.  This appears unchanged compared to the 04/17/2019 MRI.  Additional foci are noted in the cerebellum and brainstem.Marland Kitchen   Spinal cord atrophy is noted, also unchanged.   Multilevel degenerative changes as detailed above that do not lead to nerve root compression or spinal stenosis.  MRI of the lumbar spine 08/15/2019 shows severe facet arthrosis at L4-5 and L5-S1 with resultant grade 1 anterolisthesis at the lower level. Mass effect on the descending left L5 nerve root secondary to synovial cyst arising from the facet joint at L4-5, unchanged.  REVIEW OF SYSTEMS:  Constitutional: No fevers, chills, sweats, or change in appetite.  She has insomnia Eyes: No visual changes, double vision, eye pain Ear, nose and throat: No hearing loss, ear pain, nasal congestion, sore throat Cardiovascular: No chest pain, palpitations Respiratory:  No shortness of breath at rest or with exertion.   No wheezes GastrointestinaI: No nausea, vomiting, diarrhea, abdominal pain, fecal incontinence Genitourinary:  No dysuria, urinary retention or frequency.  No nocturia. Musculoskeletal:  No neck pain,She has left > right hip pain.  Lower back pain Integumentary: No rash, pruritus, skin lesions Neurological: as above Psychiatric: No depression at this time.  No anxiety Endocrine: No palpitations, diaphoresis, change in appetite, change in weigh or increased thirst Hematologic/Lymphatic:  No anemia, purpura, petechiae. Allergic/Immunologic: No itchy/runny eyes, nasal congestion, recent allergic reactions, rashes  ALLERGIES: No Known Allergies  HOME MEDICATIONS: Outpatient Medications Prior to Visit  Medication Sig Dispense Refill  . acetaminophen (TYLENOL) 500 MG tablet Take 2 tablets (1,000 mg total) by mouth every 8 (eight) hours as needed for moderate pain. 90 tablet 5  . Alemtuzumab 12 MG/1.2ML SOLN Inject 12 mg into the vein.    Allen Kell Lipoic Acid 200 MG CAPS Take 2 tablets daily 60 capsule  11  . amantadine (SYMMETREL) 100 MG capsule TAKE 1 CAPSULE(100 MG) BY MOUTH TWICE DAILY 60 capsule 5  . Cholecalciferol (VITAMIN D3) 5000 UNITS CAPS Take 1 capsule (5,000 Units total) by mouth daily. 90 capsule 3  . co-enzyme Q-10 50 MG capsule Take 50 mg by mouth daily.    Marland Kitchen EVENING PRIMROSE OIL PO Take 1 Dose by mouth daily.     Marland Kitchen imipramine (TOFRANIL) 25 MG tablet TAKE 1 TO 2 TABLETS(25 TO 50 MG) BY MOUTH AT BEDTIME 180 tablet 3  . metoprolol tartrate (LOPRESSOR) 50 MG tablet Take 50 mg by mouth daily. Takes 2-25mg  every morning    . Omeprazole (PRILOSEC PO) Take by mouth.    . oxybutynin (DITROPAN XL) 15 MG 24 hr tablet Take 1 tablet (15 mg total) by mouth at bedtime. 90 tablet 3  . oxyCODONE-acetaminophen (PERCOCET/ROXICET) 5-325 MG tablet Take 1 tablet by mouth 2 (two) times daily. 60 tablet 0  . rOPINIRole (REQUIP) 0.5 MG tablet TAKE 1 TABLET(0.5 MG) BY MOUTH AT BEDTIME 90 tablet 3  . UNABLE TO  FIND Take 1 capsule by mouth daily. Essential Amino Acids    . baclofen (LIORESAL) 10 MG tablet Take 1/2 to 1 pill po tid 90 each 11  . diazepam (VALIUM) 5 MG tablet TAKE 1 TABLET(5 MG) BY MOUTH AT BEDTIME AS NEEDED FOR ANXIETY 30 tablet 5  . pregabalin (LYRICA) 75 MG capsule Take 1 capsule (75 mg total) by mouth 2 (two) times daily. 60 capsule 5   No facility-administered medications prior to visit.  also on Tecfidera 240 mg bid, imipramine 25 mg at night and Ritalin 20 mg once or twice a day  PAST MEDICAL HISTORY: Past Medical History:  Diagnosis Date  . Movement disorder   . Multiple sclerosis (HCC)   . Neuropathy   . Pneumothorax, spontaneous, tension    Left x 2  . Vision abnormalities     PAST SURGICAL HISTORY: Past Surgical History:  Procedure Laterality Date  . PLEURAL SCARIFICATION     collasped lung     FAMILY HISTORY: Family History  Problem Relation Age of Onset  . Hypertension Mother   . Hyperlipidemia Mother   . Colon cancer Mother   . COPD Father   . Multiple  sclerosis Sister     SOCIAL HISTORY:  Social History   Socioeconomic History  . Marital status: Married    Spouse name: Not on file  . Number of children: Not on file  . Years of education: Not on file  . Highest education level: Not on file  Occupational History  . Not on file  Tobacco Use  . Smoking status: Former Smoker    Types: Cigarettes  . Smokeless tobacco: Never Used  Vaping Use  . Vaping Use: Never used  Substance and Sexual Activity  . Alcohol use: No    Alcohol/week: 0.0 standard drinks  . Drug use: No  . Sexual activity: Not on file  Other Topics Concern  . Not on file  Social History Narrative  . Not on file   Social Determinants of Health   Financial Resource Strain: Not on file  Food Insecurity: Not on file  Transportation Needs: Not on file  Physical Activity: Not on file  Stress: Not on file  Social Connections: Not on file  Intimate Partner Violence: Not on file     PHYSICAL EXAM  Vitals:   03/29/21 1500  BP: (!) 89/60  Pulse: 87  SpO2: 98%  Weight: 124 lb (56.2 kg)  Height: 5\' 5"  (1.651 m)   Pulse rechecked and was 116.  Body mass index is 20.63 kg/m.   General: The patient is well-developed and well-nourished and in no acute distress   Neurologic Exam  Mental status: The patient is alert and oriented x 3 at the time of the examination. The patient has apparent normal recent and remote memory, with an apparently normal attention span and concentration ability.   Speech is normal.  Cranial nerves: Extraocular movements are full.  Facial strength s normal.. No dysarthria is noted.   No obvious hearing deficits are noted.  Motor:  Muscle bulk is normal and tone is increased in the legs, right greater than left..   There is minimal right arm increased tone though strength was good in the arms.  Strength was poor in  Legs 2+ to 3/5 on right and 4-/5 on the left  Sensory: Sensory testing is intact to touch and vibration sensation in  all 4 extremities.  Coordination: Cerebellar testing reveals good finger-nose-finger on the left but mildly  reduced rapid alternating movements and FTN in right hand.   Heel-to-shin was reduced on the left and she cannot do the right  Gait and station: Station is unstable.   She needs support to take more than one step.  She has a right foot drop.  T.  Reflexes: Deep tendon reflexes are increased, right > left.  She has increased reflexes at the knees.  No ankle clonus    DIAGNOSTIC DATA (LABS, IMAGING, TESTING) - I reviewed patient records, labs, notes, testing and imaging myself where available.  Lab Results  Component Value Date   WBC 7.2 09/24/2019   HGB 12.4 09/24/2019   HCT 36.7 09/24/2019   MCV 86 09/24/2019   PLT 295 09/24/2019       ASSESSMENT AND PLAN  Multiple sclerosis (HCC)  High risk medication use  Spinal stenosis of lumbosacral region  Synovial cyst of lumbar spine  Spondylolisthesis of lumbosacral region  Other fatigue  Abnormal TSH  Urinary dysfunction  1.   Continue REMS program for [place].   2.   Her low back pain is likely coming from the degenerative changes at L4-L5 and L5-S1.  Her pain is worsening and I am concerned that she might have progressed further.  We will check an MRI of the lumbar spine and have her evaluated by surgery depending the results.  She also may benefit from an epidural steroid on the left L4-L5  3.   Tizanidine for spasticity.  Renew Valium to help with spasticity and insomnia. 4.   return in 6 months or sooner if there are new or worsening neurologic symptoms.  Kristiann Noyce A. Epimenio Foot, MD, PhD 03/29/2021, 7:03 PM Certified in Neurology, Clinical Neurophysiology, Sleep Medicine, Pain Medicine and Neuroimaging  [facility] [address] [place], [place] [address] [phone]

## 2021-04-04 ENCOUNTER — Encounter: Payer: Self-pay | Admitting: Neurology

## 2021-04-06 ENCOUNTER — Other Ambulatory Visit: Payer: Self-pay | Admitting: Neurology

## 2021-04-17 ENCOUNTER — Other Ambulatory Visit: Payer: Self-pay | Admitting: Neurology

## 2021-05-03 ENCOUNTER — Encounter: Payer: Self-pay | Admitting: Neurology

## 2021-05-08 ENCOUNTER — Encounter: Payer: Self-pay | Admitting: *Deleted

## 2021-06-06 ENCOUNTER — Encounter: Payer: Self-pay | Admitting: Neurology

## 2021-08-08 ENCOUNTER — Encounter: Payer: Self-pay | Admitting: Neurology

## 2021-09-04 ENCOUNTER — Encounter: Payer: Self-pay | Admitting: Neurology

## 2021-09-20 ENCOUNTER — Other Ambulatory Visit: Payer: Self-pay | Admitting: *Deleted

## 2021-09-20 MED ORDER — OXYBUTYNIN CHLORIDE ER 15 MG PO TB24: 15.0000 mg | ORAL_TABLET | Freq: Every day | ORAL | 1 refills | Status: DC

## 2021-10-04 ENCOUNTER — Encounter: Payer: Self-pay | Admitting: Neurology

## 2021-10-04 ENCOUNTER — Ambulatory Visit: Payer: Medicare HMO | Admitting: Neurology

## 2021-10-04 VITALS — BP 92/68 | HR 93 | Ht 65.0 in | Wt 128.5 lb

## 2021-10-04 DIAGNOSIS — G35 Multiple sclerosis: Secondary | ICD-10-CM

## 2021-10-04 DIAGNOSIS — R39198 Other difficulties with micturition: Secondary | ICD-10-CM | POA: Diagnosis not present

## 2021-10-04 DIAGNOSIS — M7138 Other bursal cyst, other site: Secondary | ICD-10-CM | POA: Diagnosis not present

## 2021-10-04 DIAGNOSIS — M4317 Spondylolisthesis, lumbosacral region: Secondary | ICD-10-CM | POA: Diagnosis not present

## 2021-10-04 DIAGNOSIS — Z79899 Other long term (current) drug therapy: Secondary | ICD-10-CM

## 2021-10-04 DIAGNOSIS — R269 Unspecified abnormalities of gait and mobility: Secondary | ICD-10-CM

## 2021-10-04 NOTE — Progress Notes (Signed)
[facility]  PATIENT: Katherine Cameron DOB: Apr 08, 1971  REFERRING CLINICIAN: Hedgecock, PA-C HISTORY FROM: Patient with input form husband  REASON FOR VISIT: MS and spastic gait   HISTORICAL  CHIEF COMPLAINT:  Chief Complaint  Patient presents with   Follow-up    Rm 2, w husband. Here for 6 month MS f/u, pt is on [place]. No new or worsening in sx.     HISTORY OF PRESENT ILLNESS:  Katherine Cameron is a 50 year old woman with active secondary progressive MS    Update 10/04/2021: She had the two years of [place] in 2018 and April 2019.  She is compliant with the REMS program and will be doing lab work until 2023.  She has not had a definite exacerbation since but has noted mild progression.   Labs ok except mild increased TSH  Her MS has been stable but she notes more LBP.   She notes more spasticity in her right leg, especially if cold or at night.    She takes valium and tizanidine and MJ.      She feels she has done fairly well over the last couple months.  She is walking about the same.  She uses a scooter for longer distances but is able to get around the house for short distances using a walker.  She has weakness in both legs, right greater than left, she also notes reduced fine movements in the right hand.  She has falls.   She notes reduced right hand coordination   She is right handed so has trouble writing.     Amantadine has helped fatigue.    She notes more cognitive issues.   Verbal processing and fluency are reduced.   She is forgetful.    She has reduced focus/attention.  Provigil did not help much.   Ritalin helped some but caused tachycardia.      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  CBD helps some.  She saw a spine surgeon (Dr. Precious Gilding) and was advised to wait/monitor.   She  leans forward while walking.   She sleeps in a fetal position.  Pain is actually down a bit better since the last visit.   She is sleeping much better with combination of ropinirole and imipramine.          MS History:  She presented with right optic neuritis. She went to an ophthalmologist and then was referred to neurology where she had an MRI of the brain performed. The MRI was consistent with MS. She did not need to have a lumbar puncture. This was before FDA approved medications and she had several courses of IV steroids. Around 1994, she started Avonex after a large exacerbation around 2008, she went on Tysabri. She was more stable on Tysabri. Unfortunately, she had a a high titer JCV antibody. She was on Tysabri for about 3 years and then was switched to Tecfidera. She has been on Tecfidera for about 3 years. During the transition from Tysabri to [place], she had an especially severe exacerbation with significant worsening of her gait. A new focus was noted in the cervical spine.   She switched to [place] in 2018 and had her second year April 2019.  She is compliant with the rems program   Imaging: MRI of the brain  05/24/2020 shows multiple T2/FLAIR hyperintense foci in the hemispheres, cerebellum, thalamus and brainstem in a pattern and configuration consistent with chronic demyelinating plaque associated with multiple sclerosis.  None of the foci enhance or appear to be acute.  Compared to the MRI dated 04/17/2019, there are no new lesions.   There is a normal enhancement pattern.  No acute findings.  MRI of the thoracic spine 05/24/2020 shows T2 hyperintense signal within the spinal cord consistent with chronic demyelinating plaque associated with multiple sclerosis.   No significant disc degenerative changes and spondylosis is noted.  There is no nerve root compression or spinal stenosis.  MRI of the cervical spine 05/24/2020 shows T2 hyperintense signal changes noted throughout the cervical  spinal cord.  This appears unchanged compared to the 04/17/2019 MRI.  Additional foci are noted in the cerebellum and brainstem.Marland Kitchen   Spinal cord atrophy is noted, also unchanged.   Multilevel degenerative changes as detailed above that do not lead to nerve root compression or spinal stenosis.  MRI of the lumbar spine 08/15/2019 shows severe facet arthrosis at L4-5 and L5-S1 with resultant grade 1 anterolisthesis at the lower level. Mass effect on the descending left L5 nerve root secondary to synovial cyst arising from the facet joint at L4-5, unchanged.  REVIEW OF SYSTEMS:  Constitutional: No fevers, chills, sweats, or change in appetite.  She has insomnia Eyes: No visual changes, double vision, eye pain Ear, nose and throat: No hearing loss, ear pain, nasal congestion, sore throat Cardiovascular: No chest pain, palpitations Respiratory:  No shortness of breath at rest or with exertion.   No wheezes GastrointestinaI: No nausea, vomiting, diarrhea, abdominal pain, fecal incontinence Genitourinary:  No dysuria, urinary retention or frequency.  No nocturia. Musculoskeletal:  No neck pain,She has left > right hip pain.  Lower back pain Integumentary: No rash, pruritus, skin lesions Neurological: as above Psychiatric: No depression at this time.  No anxiety Endocrine: No palpitations, diaphoresis, change in appetite, change in weigh or increased thirst Hematologic/Lymphatic:  No anemia, purpura, petechiae. Allergic/Immunologic: No itchy/runny eyes, nasal congestion, recent allergic reactions, rashes  ALLERGIES: No Known Allergies  HOME MEDICATIONS: Outpatient Medications Prior to Visit  Medication Sig Dispense Refill   acetaminophen (TYLENOL) 500 MG tablet Take 2 tablets (1,000 mg total) by mouth every 8 (eight) hours as needed for moderate pain. 90 tablet 5   Alemtuzumab 12 MG/1.2ML SOLN Inject 12 mg into the vein.     Alpha Lipoic Acid 200 MG CAPS Take 2 tablets daily 60 capsule 11    amantadine (SYMMETREL) 100 MG capsule TAKE 1 CAPSULE(100 MG) BY MOUTH TWICE DAILY 180 capsule 3   Cholecalciferol (VITAMIN D3) 5000 UNITS CAPS Take 1 capsule (5,000 Units total) by mouth daily. 90 capsule 3   co-enzyme Q-10 50 MG capsule Take 50 mg by mouth daily.     diazepam (VALIUM) 5 MG tablet TAKE 1 TABLET(5 MG) BY MOUTH AT BEDTIME AS NEEDED 30 tablet 5   EVENING PRIMROSE OIL PO Take 1 Dose by mouth daily.      imipramine (TOFRANIL) 25 MG tablet TAKE 1 TO 2 TABLETS(25 TO 50 MG) BY MOUTH AT BEDTIME 180 tablet 3   metoprolol tartrate (LOPRESSOR) 50 MG tablet Take 50 mg by mouth daily. Takes 2-25mg  every morning     Omeprazole (PRILOSEC PO) Take by mouth.     oxybutynin (DITROPAN XL) 15 MG 24 hr tablet Take 1 tablet (15 mg total) by mouth at bedtime. 90 tablet 1   oxyCODONE-acetaminophen (  PERCOCET/ROXICET) 5-325 MG tablet Take 1 tablet by mouth 2 (two) times daily. 60 tablet 0   rOPINIRole (REQUIP) 0.5 MG tablet TAKE 1 TABLET(0.5 MG) BY MOUTH AT BEDTIME 90 tablet 3   tiZANidine (ZANAFLEX) 4 MG tablet Take 1 tablet (4 mg total) by mouth 3 (three) times daily. 90 tablet 11   UNABLE TO FIND Take 1 capsule by mouth daily. Essential Amino Acids     No facility-administered medications prior to visit.  also on Tecfidera 240 mg bid, imipramine 25 mg at night and Ritalin 20 mg once or twice a day  PAST MEDICAL HISTORY: Past Medical History:  Diagnosis Date   Movement disorder    Multiple sclerosis (HCC)    Neuropathy    Pneumothorax, spontaneous, tension    Left x 2   Vision abnormalities     PAST SURGICAL HISTORY: Past Surgical History:  Procedure Laterality Date   PLEURAL SCARIFICATION     collasped lung     FAMILY HISTORY: Family History  Problem Relation Age of Onset   Hypertension Mother    Hyperlipidemia Mother    Colon cancer Mother    COPD Father    Multiple sclerosis Sister     SOCIAL HISTORY:  Social History   Socioeconomic History   Marital status: Married     Spouse name: Not on file   Number of children: Not on file   Years of education: Not on file   Highest education level: Not on file  Occupational History   Not on file  Tobacco Use   Smoking status: Former    Types: Cigarettes   Smokeless tobacco: Never  Vaping Use   Vaping Use: Never used  Substance and Sexual Activity   Alcohol use: No    Alcohol/week: 0.0 standard drinks   Drug use: No   Sexual activity: Not on file  Other Topics Concern   Not on file  Social History Narrative   Not on file   Social Determinants of Health   Financial Resource Strain: Not on file  Food Insecurity: Not on file  Transportation Needs: Not on file  Physical Activity: Not on file  Stress: Not on file  Social Connections: Not on file  Intimate Partner Violence: Not on file     PHYSICAL EXAM  Vitals:   10/04/21 1547  BP: 92/68  Pulse: 93  SpO2: 97%  Weight: 128 lb 8 oz (58.3 kg)  Height: 5\' 5"  (1.651 m)   Pulse rechecked and was 116.  Body mass index is 21.38 kg/m.   General: The patient is well-developed and well-nourished and in no acute distress   Neurologic Exam  Mental status: The patient is alert and oriented x 3 at the time of the examination. The patient has apparent normal recent and remote memory, with an apparently normal attention span and concentration ability.   Speech is normal.  Cranial nerves: Extraocular movements are full.  Facial strength s normal.. No dysarthria is noted.   No obvious hearing deficits are noted.  Motor:  Muscle bulk is normal and tone is increased in the legs, right greater than left..   There is minimal right arm increased tone though strength was good in the arms.  Strength was poor in  Legs 2+ to 3/5 on right and 4-/5 on the left  Sensory: Sensory testing is intact to touch and vibration sensation in all 4 extremities.  Coordination: Cerebellar testing reveals good finger-nose-finger on the left but mildly reduced rapid alternating  movements and FTN in right hand.   Heel-to-shin was reduced on the left and she cannot do the right  Gait and station: Station is unstable.   She needs support to take more than one step.  She has a right foot drop.  T.  Reflexes: Deep tendon reflexes are increased, right > left.  She has increased reflexes at the knees.  She does not have ankle clonus.   DIAGNOSTIC DATA (LABS, IMAGING, TESTING) - I reviewed patient records, labs, notes, testing and imaging myself where available.  Lab Results  Component Value Date   WBC 7.2 09/24/2019   HGB 12.4 09/24/2019   HCT 36.7 09/24/2019   MCV 86 09/24/2019   PLT 295 09/24/2019       ASSESSMENT AND PLAN  Multiple sclerosis (HCC)  Urinary dysfunction  Synovial cyst of lumbar spine  Spondylolisthesis of lumbosacral region  High risk medication use  Abnormal gait  1.   Continue REMS program for [place].  She would need to do this for about another 6 months.  Around the time of the next visit in 6 months we will check an MRI of the brain and spine to determine if there is any subclinical progression.  If this is occurring we could consider restarting a disease modifying therapy.  Hopefully, she will get a long-term benefit of reduce relapses and MRI changes. 2.   Her low back pain continues.  It is likely coming from the degenerative changes at L4-L5 and L5-S1.  She has seen the orthopedic spine center but watchful waiting was advised.  She also may benefit from an epidural steroid on the left L4-L5 or medial branch block/RFA. 3.   Tizanidine for spasticity.  Renew Valium to help with spasticity and insomnia. 4.   return in 6 months or sooner if there are new or worsening neurologic symptoms.   42-minute office visit with the majority of the time spent face-to-face for history and physical, discussion/counseling and decision-making.  Additional time with record review and documentation.  Katherine Cameron A. Epimenio Foot, MD, PhD 10/04/2021, 6:48  PM Certified in Neurology, Clinical Neurophysiology, Sleep Medicine, Pain Medicine and Neuroimaging  [facility] [address] [place], [place] [address] [phone]

## 2021-10-13 ENCOUNTER — Other Ambulatory Visit: Payer: Self-pay | Admitting: Neurology

## 2021-10-16 NOTE — Telephone Encounter (Signed)
Received refill request for diazepam.  Last OV was on 10/04/21.  Next OV is scheduled for 04/05/22 .  Last RX was written on 05/15/21 for 30 tabs.   [place] [facility] has been reviewed.

## 2021-11-15 ENCOUNTER — Encounter: Payer: Self-pay | Admitting: Neurology

## 2021-11-21 DIAGNOSIS — Z0289 Encounter for other administrative examinations: Secondary | ICD-10-CM

## 2021-11-28 ENCOUNTER — Telehealth: Payer: Self-pay | Admitting: *Deleted

## 2021-11-28 NOTE — Telephone Encounter (Signed)
Gave completed/signed disability form back to [facility] to process for pt.

## 2021-12-06 ENCOUNTER — Telehealth: Payer: Self-pay | Admitting: *Deleted

## 2021-12-06 NOTE — Telephone Encounter (Signed)
Pt [place] form faxed 2 times to [employer].

## 2021-12-24 ENCOUNTER — Other Ambulatory Visit: Payer: Self-pay | Admitting: Neurology

## 2021-12-25 ENCOUNTER — Encounter: Payer: Self-pay | Admitting: Neurology

## 2021-12-29 ENCOUNTER — Encounter: Payer: Self-pay | Admitting: Neurology

## 2022-01-10 ENCOUNTER — Encounter: Payer: Self-pay | Admitting: *Deleted

## 2022-01-11 NOTE — Telephone Encounter (Signed)
Called and spoke w/ pt. Relayed mychart message. She will contact home draw program to see about setting about redraw.

## 2022-02-05 ENCOUNTER — Other Ambulatory Visit: Payer: Self-pay | Admitting: Neurology

## 2022-03-06 ENCOUNTER — Other Ambulatory Visit: Payer: Self-pay | Admitting: Neurology

## 2022-03-06 ENCOUNTER — Encounter: Payer: Self-pay | Admitting: Neurology

## 2022-03-06 MED ORDER — ROPINIROLE HCL 1 MG PO TABS: 1.0000 mg | ORAL_TABLET | Freq: Every day | ORAL | 1 refills | Status: DC

## 2022-03-24 ENCOUNTER — Encounter: Payer: Self-pay | Admitting: Neurology

## 2022-04-03 ENCOUNTER — Encounter: Payer: Self-pay | Admitting: Neurology

## 2022-04-05 ENCOUNTER — Ambulatory Visit: Payer: Medicare HMO | Admitting: Neurology

## 2022-04-25 ENCOUNTER — Encounter: Payer: Self-pay | Admitting: Neurology

## 2022-05-08 ENCOUNTER — Other Ambulatory Visit: Payer: Self-pay | Admitting: Neurology

## 2022-05-08 ENCOUNTER — Encounter: Payer: Self-pay | Admitting: Neurology

## 2022-05-08 ENCOUNTER — Telehealth: Payer: Self-pay | Admitting: Neurology

## 2022-05-08 ENCOUNTER — Ambulatory Visit: Payer: Medicare HMO | Admitting: Neurology

## 2022-05-08 VITALS — BP 112/72 | HR 122 | Ht 65.0 in | Wt 128.5 lb

## 2022-05-08 DIAGNOSIS — M47816 Spondylosis without myelopathy or radiculopathy, lumbar region: Secondary | ICD-10-CM

## 2022-05-08 DIAGNOSIS — G35 Multiple sclerosis: Secondary | ICD-10-CM | POA: Diagnosis not present

## 2022-05-08 DIAGNOSIS — M7138 Other bursal cyst, other site: Secondary | ICD-10-CM | POA: Diagnosis not present

## 2022-05-08 DIAGNOSIS — M5416 Radiculopathy, lumbar region: Secondary | ICD-10-CM

## 2022-05-08 DIAGNOSIS — R269 Unspecified abnormalities of gait and mobility: Secondary | ICD-10-CM

## 2022-05-08 MED ORDER — ROPINIROLE HCL 1 MG PO TABS: 1.0000 mg | ORAL_TABLET | Freq: Every day | ORAL | 3 refills | Status: DC

## 2022-05-08 MED ORDER — MIRABEGRON ER 50 MG PO TB24: 50.0000 mg | ORAL_TABLET | Freq: Every day | ORAL | 11 refills | Status: DC

## 2022-05-08 MED ORDER — DIAZEPAM 5 MG PO TABS
ORAL_TABLET | ORAL | 0 refills | Status: DC
Start: 2022-05-08 — End: 2023-02-08

## 2022-05-08 NOTE — Progress Notes (Signed)
[facility]  PATIENT: Katherine Cameron DOB: [birth year]  REFERRING CLINICIAN: Hedgecock, PA-C HISTORY FROM: Patient with input form husband  REASON FOR VISIT: MS and spastic gait   HISTORICAL  CHIEF COMPLAINT:  Chief Complaint  Patient presents with   Follow-up    Rm 1, w husband. Here for 6 month MS f/u, on Lemtrada. Pt reports falling side ways last night for the first time. Pt would like to discuss concerns w head shaking/tremor.     HISTORY OF PRESENT ILLNESS:  Katherine Cameron is a 51 year old woman with active secondary progressive MS    Update 05/08/2022: She had the two years of [place] in 2018 and April 2019.  She ihas completed the 4 years of  the REMS program and will be doing lab work until 2023.  She has not had a definite exacerbation since but has noted mild progression.   Labs were ok except mild increased TSH   She sees endocrinology.  She is able to go 50 feet with a walker if well rested.    She has a folding power wheelchair and likes it better than the scooter.   She has weakness in both legs, right greater than left, she also notes reduced fine movements in the right hand.  She has falls.   She notes reduced right hand coordination and fine motor skills.   She is right handed so has trouble writing.    She  has urgency but no incontinence.   Oxybutynin helps but dries her mouth.   Vesicare and flomax had not helped as muchin past.    Her MS has been stable but she notes more LBP.   She notes more spasticity in her right leg, especially if cold or at night.    She takes valium and tizanidine and MJ.      She has had a mild tremor in her head > hands over last 6 months.     Amantadine has helped fatigue.    She notes more cognitive issues.   Verbal processing and fluency are reduced.   She is forgetful.    She has reduced focus/attention.  Provigil did not help much.   Ritalin helped some but caused tachycardia.      She is experiencing LBP and has L4L5  and L5S1 DJD with facet hypertrophy and L5S1 anterolisthesis.  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  CBD helps some.  She saw a spine surgeon (Dr. Precious Gilding) and was advised to wait/monitor.   She leans forward while walking.   She sleeps in a fetal position.  Pain is actually down a bit better since the last visit.  She is sleeping much better with combination of ropinirole and imipramine.          MS History:  She presented with right optic neuritis. She went to an ophthalmologist and then was referred to neurology where she had an MRI of the brain performed. The MRI was consistent with MS. She did not need to have a lumbar puncture. This was before FDA approved medications and she had several courses of IV steroids. Around 1994, she started Avonex after a large exacerbation around 2008, she went on Tysabri. She was more stable on Tysabri. Unfortunately, she had a a high titer JCV antibody. She was on Tysabri for about 3 years and then was switched to Tecfidera. She has been on Tecfidera for  about 3 years. During the transition from Tysabri to Cyr, she had an especially severe exacerbation with significant worsening of her gait. A new focus was noted in the cervical spine.   She switched to [place] in 2018 and had her second year April 2019.  She is compliant with the rems program   Imaging: MRI of the brain 05/24/2020 shows multiple T2/FLAIR hyperintense foci in the hemispheres, cerebellum, thalamus and brainstem in a pattern and configuration consistent with chronic demyelinating plaque associated with multiple sclerosis.  None of the foci enhance or appear to be acute.  Compared to the MRI dated 04/17/2019, there are no new lesions.   There is a normal enhancement pattern.  No acute findings.  MRI of the thoracic spine 05/24/2020 shows T2 hyperintense signal within the spinal cord consistent with chronic demyelinating  plaque associated with multiple sclerosis.   No significant disc degenerative changes and spondylosis is noted.  There is no nerve root compression or spinal stenosis.  MRI of the cervical spine 05/24/2020 shows T2 hyperintense signal changes noted throughout the cervical spinal cord.  This appears unchanged compared to the 04/17/2019 MRI.  Additional foci are noted in the cerebellum and brainstem.Marland Kitchen   Spinal cord atrophy is noted, also unchanged.   Multilevel degenerative changes as detailed above that do not lead to nerve root compression or spinal stenosis.  MRI of the lumbar spine 08/15/2019 shows severe facet arthrosis at L4-5 and L5-S1 with resultant grade 1 anterolisthesis at the lower level. Mass effect on the descending left L5 nerve root secondary to synovial cyst arising from the facet joint at L4-5, unchanged.  REVIEW OF SYSTEMS:  Constitutional: No fevers, chills, sweats, or change in appetite.  She has insomnia Eyes: No visual changes, double vision, eye pain Ear, nose and throat: No hearing loss, ear pain, nasal congestion, sore throat Cardiovascular: No chest pain, palpitations Respiratory:  No shortness of breath at rest or with exertion.   No wheezes GastrointestinaI: No nausea, vomiting, diarrhea, abdominal pain, fecal incontinence Genitourinary:  No dysuria, urinary retention or frequency.  No nocturia. Musculoskeletal:  No neck pain,She has left > right hip pain.  Lower back pain Integumentary: No rash, pruritus, skin lesions Neurological: as above Psychiatric: No depression at this time.  No anxiety Endocrine: No palpitations, diaphoresis, change in appetite, change in weigh or increased thirst Hematologic/Lymphatic:  No anemia, purpura, petechiae. Allergic/Immunologic: No itchy/runny eyes, nasal congestion, recent allergic reactions, rashes  ALLERGIES: No Known Allergies  HOME MEDICATIONS: Outpatient Medications Prior to Visit  Medication Sig Dispense Refill    acetaminophen (TYLENOL) 500 MG tablet Take 2 tablets (1,000 mg total) by mouth every 8 (eight) hours as needed for moderate pain. 90 tablet 5   Alemtuzumab 12 MG/1.2ML SOLN Inject 12 mg into the vein.     Alpha Lipoic Acid 200 MG CAPS Take 2 tablets daily 60 capsule 11   amantadine (SYMMETREL) 100 MG capsule TAKE 1 CAPSULE(100 MG) BY MOUTH TWICE DAILY 180 capsule 2   Cholecalciferol (VITAMIN D3) 5000 UNITS CAPS Take 1 capsule (5,000 Units total) by mouth daily. 90 capsule 3   co-enzyme Q-10 50 MG capsule Take 50 mg by mouth daily.     diazepam (VALIUM) 5 MG tablet TAKE 1 TABLET(5 MG) BY MOUTH AT BEDTIME AS NEEDED 30 tablet 0   EVENING PRIMROSE OIL PO Take 1 Dose by mouth daily.      imipramine (TOFRANIL) 25 MG tablet TAKE 1 TO 2 TABLETS(25 TO 50 MG) BY MOUTH  AT BEDTIME 180 tablet 2   metoprolol tartrate (LOPRESSOR) 50 MG tablet Take 50 mg by mouth daily. Takes 2-25mg  every morning     Omeprazole (PRILOSEC PO) Take by mouth.     oxybutynin (DITROPAN XL) 15 MG 24 hr tablet TAKE 1 TABLET BY MOUTH EVERYDAY AT BEDTIME 90 tablet 2   oxyCODONE-acetaminophen (PERCOCET/ROXICET) 5-325 MG tablet Take 1 tablet by mouth 2 (two) times daily. 60 tablet 0   tiZANidine (ZANAFLEX) 4 MG tablet TAKE 1 TABLET BY MOUTH 3 TIMES DAILY. 270 tablet 2   UNABLE TO FIND Take 1 capsule by mouth daily. Essential Amino Acids     rOPINIRole (REQUIP) 1 MG tablet Take 1 tablet (1 mg total) by mouth at bedtime. 90 tablet 1   No facility-administered medications prior to visit.  also on Tecfidera 240 mg bid, imipramine 25 mg at night and Ritalin 20 mg once or twice a day  PAST MEDICAL HISTORY: Past Medical History:  Diagnosis Date   Movement disorder    Multiple sclerosis (HCC)    Neuropathy    Pneumothorax, spontaneous, tension    Left x 2   Vision abnormalities     PAST SURGICAL HISTORY: Past Surgical History:  Procedure Laterality Date   PLEURAL SCARIFICATION     collasped lung     FAMILY HISTORY: Family  History  Problem Relation Age of Onset   Hypertension Mother    Hyperlipidemia Mother    Colon cancer Mother    COPD Father    Multiple sclerosis Sister     SOCIAL HISTORY:  Social History   Socioeconomic History   Marital status: Married    Spouse name: Not on file   Number of children: Not on file   Years of education: Not on file   Highest education level: Not on file  Occupational History   Not on file  Tobacco Use   Smoking status: Former    Types: Cigarettes   Smokeless tobacco: Never  Vaping Use   Vaping Use: Never used  Substance and Sexual Activity   Alcohol use: No    Alcohol/week: 0.0 standard drinks   Drug use: No   Sexual activity: Not on file  Other Topics Concern   Not on file  Social History Narrative   Not on file   Social Determinants of Health   Financial Resource Strain: Not on file  Food Insecurity: Not on file  Transportation Needs: Not on file  Physical Activity: Not on file  Stress: Not on file  Social Connections: Not on file  Intimate Partner Violence: Not on file     PHYSICAL EXAM  Vitals:   05/08/22 1033  BP: 112/72  Pulse: (!) 122  Weight: 128 lb 8 oz (58.3 kg)  Height:  (1.651 m)   Pulse rechecked and was 116.  Body mass index is 21.38 kg/m.   General: The patient is well-developed and well-nourished and in no acute distress   Neurologic Exam  Mental status: The patient is alert and oriented x 3 at the time of the examination. The patient has apparent normal recent and remote memory, with an apparently normal attention span and concentration ability.   Speech is normal.  Cranial nerves: Extraocular movements are full.  Facial strength s normal.. No dysarthria is noted.   No obvious hearing deficits are noted.  Motor: I did not appreciate a tremor today.  Muscle bulk is normal and tone is increased in the legs, right greater than left.Marland Kitchen  There is minimal right arm increased tone though strength was good in the  arms.  Strength was poor in  Legs 2+ to 3/5 on right and 4-/5 on the left  Sensory: Sensory testing is intact to touch and vibration sensation in all 4 extremities.  Coordination: Cerebellar testing reveals good finger-nose-finger on the left but mildly reduced rapid alternating movements and FTN in right hand.   Heel-to-shin was reduced on the left and she cannot do the right  Gait and station: Station is unstable.   She needs support to take more than one step.  She has a right foot drop.   Reflexes: Deep tendon reflexes are increased, right > left.  She has increased reflexes at the knees.  She does not have ankle clonus.   DIAGNOSTIC DATA (LABS, IMAGING, TESTING) - I reviewed patient records, labs, notes, testing and imaging myself where available.  Lab Results  Component Value Date   WBC 7.2 09/24/2019   HGB 12.4 09/24/2019   HCT 36.7 09/24/2019   MCV 86 09/24/2019   PLT 295 09/24/2019       ASSESSMENT AND PLAN  Multiple sclerosis (HCC) - Plan: MR BRAIN W WO CONTRAST  Lumbar radiculopathy - Plan: MR LUMBAR SPINE WO CONTRAST  Facet syndrome, lumbar - Plan: MR LUMBAR SPINE WO CONTRAST  Synovial cyst of lumbar spine - Plan: MR LUMBAR SPINE WO CONTRAST  Abnormal gait - Plan: MR BRAIN W WO CONTRAST  1.   Continue REMS program for Lemtrada.  She would need to do this for about another 6 months.  Around the time of the next visit in 6 months we will check an MRI of the brain and spine to determine if there is any subclinical progression.  If this is occurring we could consider restarting a disease modifying therapy.  Hopefully, she will get a long-term benefit of reduce relapses and MRI changes. 2.   Her low back pain continues.  It is likely coming from the degenerative changes at L4-L5 and L5-S1.  She has seen the orthopedic spine center but watchful waiting was advised due to her weakness and probability of slow recovery..  She also may benefit from an epidural steroid on the  left L4-L5 or medial branch block/RFA and we will consider 1 of these if pain persists.. 3.   Tizanidine for spasticity.  Renew Valium to help with spasticity and insomnia. 4.   She and her husband had questions about stem cells for MS.  We had a discussion about the difference between hematopoietic and mesenchymal stem cells and that there is some evidence that hemopoietic stem cells transplants appear to have a benefit that at least is good his Ocrevus though probably not much more.  Mesenchymal stem cells hold some promise in the future but that the simple procedures were cells are purified from either fat or bone marrow and reinjected within a day or 2 are unlikely to have any significant benefit.   5.   We also discussed marijuana.  There is some evidence that it can help her spasticity and MS.  Medical marijuana is not approved in [place] and is unlikely to be approved in the next year or 2.   6.  Return in 6 months or sooner if there are new or worsening neurologic symptoms.   41-minute office visit with the majority of the time spent face-to-face for history and physical, discussion/counseling and decision-making.  Additional time with record review and documentation.  Adah Stoneberg A. Haylen Bellotti,  MD, PhD 05/08/2022, 1:05 PM Certified in Neurology, Clinical Neurophysiology, Sleep Medicine, Pain Medicine and Neuroimaging  [facility] [address] [facility], [place] [address] [phone]

## 2022-05-08 NOTE — Telephone Encounter (Signed)
Last OV was on 05/08/22. Next OV is scheduled for 11/20/22.  Last RX was written on 10/16/21 for 30 tabs.   [place] [facility] has been reviewed.

## 2022-05-08 NOTE — Telephone Encounter (Signed)
[employer] medicare sent to [facility] they obtain auth and will call the patient to schedule 

## 2022-07-09 ENCOUNTER — Encounter: Payer: Self-pay | Admitting: Neurology

## 2022-07-09 DIAGNOSIS — G35 Multiple sclerosis: Secondary | ICD-10-CM

## 2022-07-09 DIAGNOSIS — Z79899 Other long term (current) drug therapy: Secondary | ICD-10-CM

## 2022-07-09 DIAGNOSIS — Z111 Encounter for screening for respiratory tuberculosis: Secondary | ICD-10-CM

## 2022-07-10 ENCOUNTER — Other Ambulatory Visit: Payer: Self-pay | Admitting: *Deleted

## 2022-07-10 DIAGNOSIS — G35 Multiple sclerosis: Secondary | ICD-10-CM

## 2022-07-10 DIAGNOSIS — Z79899 Other long term (current) drug therapy: Secondary | ICD-10-CM

## 2022-07-10 DIAGNOSIS — Z111 Encounter for screening for respiratory tuberculosis: Secondary | ICD-10-CM

## 2022-07-17 ENCOUNTER — Ambulatory Visit
Admission: RE | Admit: 2022-07-17 | Discharge: 2022-07-17 | Disposition: A | Discharge status: Home / Self Care | Payer: Medicare HMO | Source: Ambulatory Visit | Attending: Neurology | Admitting: Neurology

## 2022-07-17 DIAGNOSIS — G35 Multiple sclerosis: Secondary | ICD-10-CM

## 2022-07-17 DIAGNOSIS — M7138 Other bursal cyst, other site: Secondary | ICD-10-CM

## 2022-07-17 DIAGNOSIS — M5416 Radiculopathy, lumbar region: Secondary | ICD-10-CM

## 2022-07-17 DIAGNOSIS — R269 Unspecified abnormalities of gait and mobility: Secondary | ICD-10-CM

## 2022-07-17 DIAGNOSIS — M47816 Spondylosis without myelopathy or radiculopathy, lumbar region: Secondary | ICD-10-CM

## 2022-07-17 MED ORDER — GADOBENATE DIMEGLUMINE 529 MG/ML IV SOLN
12.0000 mL | Freq: Once | INTRAVENOUS | Status: AC | PRN
Start: 2022-07-17 — End: 2022-07-17
  Administered 2022-07-17: 12 mL via INTRAVENOUS

## 2022-07-24 ENCOUNTER — Telehealth: Payer: Self-pay

## 2022-07-24 NOTE — Telephone Encounter (Signed)
Lemtrada order and start form signed by Dr. Epimenio Foot. Given to our infusion suite for processing. Copy sent to MR.  Start form faxed to [employer]. Received a receipt of confirmation. Monthly lab orders through [facility] faxed to [facility]. Received a receipt of confirmation.

## 2022-07-27 NOTE — Telephone Encounter (Signed)
Mallie Darting called, would need to send order of patients  baseline labs to [facility]. If have any questions can contact me at [phone]

## 2022-07-30 NOTE — Telephone Encounter (Signed)
I called Katherine Cameron back. I'm not sure why we need to send another order for baseline labs to [facility]. Patient has already been cleared by Dr. Epimenio Foot to start [facility].  I left her a voicemail asking her to call me back.

## 2022-07-30 NOTE — Telephone Encounter (Signed)
I called Katherine Cameron. The order for [facility] should be for the monthly maintenance follow up labs after her Lemtrada infusion. No baseline labs needed; Dr. Epimenio Foot reports that he doesn't need these since he reviewed her most recent labs from the [facility] REMS program.

## 2022-07-30 NOTE — Telephone Encounter (Signed)
Katherine Cameron has called back asking for a call to confirm if she needs to disregard form  since they have order for monthly lab, please call.

## 2022-07-31 LAB — QUANTIFERON-TB GOLD PLUS
QuantiFERON Mitogen Value: >10 IU/mL
QuantiFERON Nil Value: 0.06 IU/mL
QuantiFERON TB1 Ag Value: 0.05 IU/mL
QuantiFERON TB2 Ag Value: 0.06 IU/mL
QuantiFERON-TB Gold Plus: NEGATIVE

## 2022-08-13 ENCOUNTER — Telehealth: Payer: Self-pay | Admitting: Neurology

## 2022-08-13 ENCOUNTER — Encounter: Payer: Self-pay | Admitting: Neurology

## 2022-08-13 ENCOUNTER — Telehealth (INDEPENDENT_AMBULATORY_CARE_PROVIDER_SITE_OTHER): Payer: Medicare HMO | Admitting: Neurology

## 2022-08-13 DIAGNOSIS — G35 Multiple sclerosis: Secondary | ICD-10-CM

## 2022-08-13 DIAGNOSIS — Z79899 Other long term (current) drug therapy: Secondary | ICD-10-CM

## 2022-08-13 DIAGNOSIS — M47816 Spondylosis without myelopathy or radiculopathy, lumbar region: Secondary | ICD-10-CM

## 2022-08-13 DIAGNOSIS — R269 Unspecified abnormalities of gait and mobility: Secondary | ICD-10-CM

## 2022-08-13 DIAGNOSIS — R39198 Other difficulties with micturition: Secondary | ICD-10-CM

## 2022-08-13 NOTE — Telephone Encounter (Signed)
Labs orders released. Baxter Hire, can you  help w/ [facility] start/getting form to pt? Thank you

## 2022-08-13 NOTE — Telephone Encounter (Signed)
Julaine Hua order has been cancelled. Informed Kim, RN in infusion suite.

## 2022-08-13 NOTE — Telephone Encounter (Signed)
RoseAnne@ [facility] is asking for a call to discuss infusion treatment.  Pt has graduated from their program, they would like to know about future infusion treatment, call back # [phone]

## 2022-08-13 NOTE — Progress Notes (Signed)
[facility]  PATIENT: Katherine Cameron DOB: September 15, 1971  REFERRING CLINICIAN: Hedgecock, PA-C HISTORY FROM: Patient with input form husband  REASON FOR VISIT: MS and spastic gait   HISTORICAL  CHIEF COMPLAINT:  Chief Complaint  Patient presents with   Multiple Sclerosis    HISTORY OF PRESENT ILLNESS:  Katherine Cameron is a 51 year old woman with active secondary progressive MS    Update 08/13/2022: Although she has had no definite exacerbation, she is experiencing more weakness in her legs and has had more falls.    She has not been on any disease modifying therapy for the last 4 years.   She had the two years of [place] in 2018 and April 2019.  She ihas completed the 4 years of  the REMS program and will be doing lab work until 2023.  She has not had a definite exacerbation since but has noted mild progression.   Due to thyroid abnormalities noted on blood work, she sees endocrinology.  Currently, she is able to go 30 to 50 feet with a walker if well rested.    She has a folding power wheelchair and likes it better than the scooter.   She has weakness in both legs, right greater than left, she also notes reduced fine movements in the right hand.  She has falls.   She notes reduced right hand coordination and fine motor skills.   She is right handed so has trouble writing.    She  has urgency but no incontinence.   Oxybutynin helps but dries her mouth.   Vesicare and flomax had not helped as muchin past.    Her MS has been stable but she notes more LBP.   She notes more spasticity in her right leg, especially if cold or at night.    She takes valium and tizanidine and MJ.      She has had a mild tremor in her head > hands over last 6 months.   This is fairly stable.  Amantadine has helped fatigue.    She notes more cognitive issues.   Verbal processing and fluency are reduced.   She is forgetful.    She has reduced focus/attention.  Provigil did not help much.   Ritalin helped  some but caused tachycardia.      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  CBD helps some.  She saw a spine surgeon (Dr. Precious Gilding) and was advised to wait/monitor.   She leans forward while walking.   She sleeps in a fetal position.  Pain is actually down a bit better since the last visit.  She is on imipramine and ropinirole at night and feels the restless leg syndrome has done better.  She sleeps well most nights.         MS History:  She presented with right optic neuritis. She went to an ophthalmologist and then was referred to neurology where she had an MRI of the brain performed. The MRI was consistent with MS. She did not need to have a lumbar puncture. This was before FDA approved medications and she had several courses of IV steroids. Around 1994, she started Avonex after a large exacerbation around 2008, she went on Tysabri. She was more stable on Tysabri. Unfortunately, she had a a high titer JCV antibody.  She was on Tysabri for about 3 years and then was switched to Tecfidera. She has been on Tecfidera for about 3 years. During the transition from Tysabri to [place], she had an especially severe exacerbation with significant worsening of her gait. A new focus was noted in the cervical spine.   She switched to [place] in 2018 and had her second year April 2019.  She is compliant with the rems program   Imaging: MRI of the brain 05/24/2020 shows multiple T2/FLAIR hyperintense foci in the hemispheres, cerebellum, thalamus and brainstem in a pattern and configuration consistent with chronic demyelinating plaque associated with multiple sclerosis.  None of the foci enhance or appear to be acute.  Compared to the MRI dated 04/17/2019, there are no new lesions.   There is a normal enhancement pattern.  No acute  findings.  MRI of the thoracic spine 05/24/2020 shows T2 hyperintense signal within the spinal cord consistent with chronic demyelinating plaque associated with multiple sclerosis.   No significant disc degenerative changes and spondylosis is noted.  There is no nerve root compression or spinal stenosis.  MRI of the cervical spine 05/24/2020 shows T2 hyperintense signal changes noted throughout the cervical spinal cord.  This appears unchanged compared to the 04/17/2019 MRI.  Additional foci are noted in the cerebellum and brainstem.Marland Kitchen   Spinal cord atrophy is noted, also unchanged.   Multilevel degenerative changes as detailed above that do not lead to nerve root compression or spinal stenosis.  MRI of the lumbar spine 08/15/2019 shows severe facet arthrosis at L4-5 and L5-S1 with resultant grade 1 anterolisthesis at the lower level. Mass effect on the descending left L5 nerve root secondary to synovial cyst arising from the facet joint at L4-5, unchanged.  REVIEW OF SYSTEMS:  Constitutional: No fevers, chills, sweats, or change in appetite.  She has insomnia Eyes: No visual changes, double vision, eye pain Ear, nose and throat: No hearing loss, ear pain, nasal congestion, sore throat Cardiovascular: No chest pain, palpitations Respiratory:  No shortness of breath at rest or with exertion.   No wheezes GastrointestinaI: No nausea, vomiting, diarrhea, abdominal pain, fecal incontinence Genitourinary:  No dysuria, urinary retention or frequency.  No nocturia. Musculoskeletal:  No neck pain,She has left > right hip pain.  Lower back pain Integumentary: No rash, pruritus, skin lesions Neurological: as above Psychiatric: No depression at this time.  No anxiety Endocrine: No palpitations, diaphoresis, change in appetite, change in weigh or increased thirst Hematologic/Lymphatic:  No anemia, purpura, petechiae. Allergic/Immunologic: No itchy/runny eyes, nasal congestion, recent allergic reactions,  rashes  ALLERGIES: No Known Allergies  HOME MEDICATIONS: Outpatient Medications Prior to Visit  Medication Sig Dispense Refill   acetaminophen (TYLENOL) 500 MG tablet Take 2 tablets (1,000 mg total) by mouth every 8 (eight) hours as needed for moderate pain. 90 tablet 5   Alemtuzumab 12 MG/1.2ML SOLN Inject 12 mg into the vein.     Alpha Lipoic Acid 200 MG CAPS Take 2 tablets daily 60 capsule 11   amantadine (SYMMETREL) 100 MG capsule TAKE 1 CAPSULE(100 MG) BY MOUTH TWICE DAILY 180 capsule 2   Cholecalciferol (VITAMIN D3) 5000 UNITS CAPS Take 1 capsule (5,000 Units total) by mouth daily. 90 capsule 3   co-enzyme Q-10 50 MG capsule Take 50 mg by mouth daily.     diazepam (VALIUM) 5 MG tablet TAKE 1 TABLET(5 MG) BY MOUTH AT BEDTIME AS NEEDED 30 tablet 0   EVENING PRIMROSE OIL PO Take 1 Dose by mouth daily.  imipramine (TOFRANIL) 25 MG tablet TAKE 1 TO 2 TABLETS(25 TO 50 MG) BY MOUTH AT BEDTIME 180 tablet 2   metoprolol tartrate (LOPRESSOR) 50 MG tablet Take 50 mg by mouth daily. Takes 2-25mg  every morning     mirabegron ER (MYRBETRIQ) 50 MG TB24 tablet Take 1 tablet (50 mg total) by mouth daily. 30 tablet 11   Omeprazole (PRILOSEC PO) Take by mouth.     oxybutynin (DITROPAN XL) 15 MG 24 hr tablet TAKE 1 TABLET BY MOUTH EVERYDAY AT BEDTIME 90 tablet 2   oxyCODONE-acetaminophen (PERCOCET/ROXICET) 5-325 MG tablet Take 1 tablet by mouth 2 (two) times daily. 60 tablet 0   rOPINIRole (REQUIP) 1 MG tablet Take 1 tablet (1 mg total) by mouth at bedtime. 90 tablet 3   tiZANidine (ZANAFLEX) 4 MG tablet TAKE 1 TABLET BY MOUTH 3 TIMES DAILY. 270 tablet 2   UNABLE TO FIND Take 1 capsule by mouth daily. Essential Amino Acids     No facility-administered medications prior to visit.  also on Tecfidera 240 mg bid, imipramine 25 mg at night and Ritalin 20 mg once or twice a day  PAST MEDICAL HISTORY: Past Medical History:  Diagnosis Date   Movement disorder    Multiple sclerosis (HCC)    Neuropathy     Pneumothorax, spontaneous, tension    Left x 2   Vision abnormalities     PAST SURGICAL HISTORY: Past Surgical History:  Procedure Laterality Date   PLEURAL SCARIFICATION     collasped lung     FAMILY HISTORY: Family History  Problem Relation Age of Onset   Hypertension Mother    Hyperlipidemia Mother    Colon cancer Mother    COPD Father    Multiple sclerosis Sister      PHYSICAL EXAM   Weight is 128 pounds  She is a well-developed well-nourished woman in no acute distress.  The head is normocephalic and atraumatic.  Sclera are anicteric.  Visible skin appears normal.  The neck has a good range of motion.     She is alert and fully oriented with fluent speech and good attention, knowledge and memory.  Extraocular muscles are intact.  Facial strength is normal.    She appears to have normal strength in the arms.  Rapid alternating movements and finger-nose-finger are performed well.      DIAGNOSTIC DATA (LABS, IMAGING, TESTING) - I reviewed patient records, labs, notes, testing and imaging myself where available.  Lab Results  Component Value Date   WBC 7.2 09/24/2019   HGB 12.4 09/24/2019   HCT 36.7 09/24/2019   MCV 86 09/24/2019   PLT 295 09/24/2019       ASSESSMENT AND PLAN  Multiple sclerosis (HCC) - Plan: Hepatitis B core antibody, total, Hepatitis B surface antibody,qualitative, Hep B Surface Antigen, Hepatitis C antibody, Varicella zoster antibody, IgG, HIV Antibody (routine testing w rflx)  High risk medication use - Plan: Hepatitis B core antibody, total, Hepatitis B surface antibody,qualitative, Hep B Surface Antigen, Hepatitis C antibody, Varicella zoster antibody, IgG, HIV Antibody (routine testing w rflx)  Facet syndrome, lumbar  Abnormal gait  Urinary dysfunction  1.   Due to additional MS changes, we will restart a DMT.  We discussed a third cycle of Lemtrada and Mavenclad.  Is difficult to know which one she would do best on.  Mavenclad  would likely be safer.    We will need to chjeck Hep B/C and HIV labs.  TB test was negative last month.  2.   If LBP/radiculopathy worsen again, consider epidural steroid on the left L4-L5 or medial branch block/RFA  3.   Tizanidine for spasticity.  Renew Valium to help with spasticity and insomnia. 4.    Return in 3 months or sooner if there are new or worsening neurologic symptoms.   30-minute office visit with the majority of the time spent face-to-face for history and physical, discussion/counseling and decision-making.  Additional time with record review and documentation.   Marrio Scribner A. Epimenio Foot, MD, PhD 08/13/2022, 5:54 PM Certified in Neurology, Clinical Neurophysiology, Sleep Medicine, Pain Medicine and Neuroimaging  [facility] [address] [place], [place] [address] [phone]

## 2022-08-13 NOTE — Telephone Encounter (Signed)
I called Katherine Cameron at [facility] and advised her that Katherine Cameron has been cancelled.

## 2022-08-16 ENCOUNTER — Telehealth: Payer: Self-pay | Admitting: *Deleted

## 2022-08-16 LAB — VARICELLA ZOSTER ANTIBODY, IGG: Varicella zoster IgG: >4000 index (ref >165)

## 2022-08-16 LAB — HEPATITIS B SURFACE ANTIGEN: Hepatitis B Surface Ag: NEGATIVE

## 2022-08-16 LAB — HEPATITIS C ANTIBODY: Hep C Virus Ab: NONREACTIVE

## 2022-08-16 LAB — HEPATITIS B CORE ANTIBODY, TOTAL: Hep B Core Total Ab: NEGATIVE

## 2022-08-16 LAB — HIV ANTIBODY (ROUTINE TESTING W REFLEX): HIV Screen 4th Generation wRfx: NONREACTIVE

## 2022-08-16 LAB — HEPATITIS B SURFACE ANTIBODY,QUALITATIVE: Hep B Surface Ab, Qual: NONREACTIVE

## 2022-08-16 NOTE — Telephone Encounter (Signed)
Called pt and relayed results per Dr. Bonnita Hollow note. Pt already signed start form. I faxed start form to [employer] at [phone]. Received fax confirmation.

## 2022-08-16 NOTE — Telephone Encounter (Signed)
-----   Message from [name], MD sent at 08/16/2022 10:04 AM EDT ----- Katherine Cameron is fine for [facility] We discussed Mavenclad vs addnl course of Lemtrada - she is fine for either but we were leaning towards Mavenclad at the video visit)

## 2022-08-20 NOTE — Telephone Encounter (Signed)
Completed PA for [facility] via CMM. Sent to [employer]. Should have a determination within 3-5 business days. Key: [facility]

## 2022-08-20 NOTE — Telephone Encounter (Signed)
PA for [facility] was denied by [employer] since patient has not tried and failed [place].  I spoke with Dr. Felecia Shelling. Patient needs a more efficacious therapy than aubagio. He does not recommend Gilenya in patient's 50y/o and above due to PML risk.  Will complete an appeal.

## 2022-08-20 NOTE — Telephone Encounter (Signed)
Completed PA for mavenclad via CMM. Sent to [employer]. Should have a determination within 3-5 business days. Key: [id number] 

## 2022-08-21 NOTE — Telephone Encounter (Signed)
Faxed appeal for mavenclad to [facility]. Received a receipt of confirmation.

## 2022-08-27 NOTE — Telephone Encounter (Signed)
Appeal approval from [facility] regarding mavenclad faxed to . Received a receipt of confirmation.

## 2022-08-27 NOTE — Telephone Encounter (Signed)
I called [employer]. The appeal for mavenclad was approved from 12/03/2021-12/02/2022. Appeal #: [id number]. They will be faxing me a copy of this determination letter.

## 2022-10-03 ENCOUNTER — Other Ambulatory Visit: Payer: Self-pay | Admitting: *Deleted

## 2022-10-03 ENCOUNTER — Encounter: Payer: Self-pay | Admitting: Neurology

## 2022-10-03 MED ORDER — ROPINIROLE HCL 1 MG PO TABS: 2.0000 mg | ORAL_TABLET | Freq: Every day | ORAL | 2 refills | Status: DC

## 2022-10-17 ENCOUNTER — Telehealth: Payer: Self-pay | Admitting: *Deleted

## 2022-10-17 NOTE — Telephone Encounter (Signed)
Gave completed/signed disability form back to medical records to process for pt. 

## 2022-10-17 NOTE — Telephone Encounter (Signed)
Pt form faxed on 10/17/2022 and copy mailed to home address.

## 2022-10-23 ENCOUNTER — Emergency Department (HOSPITAL_BASED_OUTPATIENT_CLINIC_OR_DEPARTMENT_OTHER): Payer: Medicare HMO

## 2022-10-23 ENCOUNTER — Emergency Department (HOSPITAL_BASED_OUTPATIENT_CLINIC_OR_DEPARTMENT_OTHER)
Admission: EM | Admit: 2022-10-23 | Discharge: 2022-10-23 | Disposition: A | Discharge status: Home / Self Care | Payer: Medicare HMO | Attending: Emergency Medicine | Admitting: Emergency Medicine

## 2022-10-23 DIAGNOSIS — W06XXXA Fall from bed, initial encounter: Secondary | ICD-10-CM | POA: Insufficient documentation

## 2022-10-23 DIAGNOSIS — S6991XA Unspecified injury of right wrist, hand and finger(s), initial encounter: Secondary | ICD-10-CM | POA: Diagnosis present

## 2022-10-23 DIAGNOSIS — S62616A Displaced fracture of proximal phalanx of right little finger, initial encounter for closed fracture: Secondary | ICD-10-CM | POA: Diagnosis not present

## 2022-10-23 MED ORDER — LIDOCAINE HCL (PF) 1 % IJ SOLN
10.0000 mL | Freq: Once | INTRAMUSCULAR | Status: AC
  Administered 2022-10-23: 10 mL
  Filled 2022-10-23: qty 10

## 2022-10-23 NOTE — ED Triage Notes (Signed)
Pt took oxycodone 2 hour prior to arrival approx 11ish

## 2022-10-23 NOTE — ED Triage Notes (Signed)
Pt has MS with history of frequent falls. Pt fell 2 days ago getting gout of bed and landed on right pinky finger. Pt denies hitting head. Denies pain anywhere else on body. Pt is able to move right wrist and hand. Right pinky finger is swollen and bruised and pt is able to move slightly but hurts when doing so.

## 2022-10-23 NOTE — ED Notes (Signed)
Discharge instructions reviewed with patient. Patient verbalizes understanding, no further questions at this time. Medications/prescriptions and follow up information provided. No acute distress noted at time of departure.  

## 2022-10-23 NOTE — Discharge Instructions (Addendum)
You have a fracture of the proximal phalanx (first bone) in your pinkie finger.  Please keep the splint on and take Tylenol and ibuprofen for pain.  Follow-up with a hand surgeon I given you the information for the office  Please use Tylenol or ibuprofen for pain.  You may use 600 mg ibuprofen every 6 hours or 1000 mg of Tylenol every 6 hours.  You may choose to alternate between the 2.  This would be most effective.  Not to exceed 4 g of Tylenol within 24 hours.  Not to exceed 3200 mg ibuprofen 24 hours.

## 2022-10-23 NOTE — ED Provider Notes (Signed)
[facility] EMERGENCY DEPARTMENT Provider Note   CSN: [id number] Arrival date & time: 10/23/22  1245     History  Chief Complaint  Patient presents with   Finger Injury    Katherine Cameron is a 51 y.o. female.  HPI  Patient is a 51 year old female with a past medical history significant for MS  She has a history of frequent falls that resulted for her MS.  She states she fell 2 days ago and out of bed and landed on her right pinky finger strangely causing some pain.  She states that she did not strike her head or lose consciousness denies any nausea vomiting confusion slurred speech, lightheadedness or dizziness.  No other associated areas of pain.  She has not had any pain in her wrist or hand other than in her right pinky finger.  Her sensation is normal which is somewhat decreased from her left side but this is normal for her.     Home Medications Prior to Admission medications   Medication Sig Start Date End Date Taking? Authorizing Provider  acetaminophen (TYLENOL) 500 MG tablet Take 2 tablets (1,000 mg total) by mouth every 8 (eight) hours as needed for moderate pain. 09/24/19   Sater, Pearletha Furl, MD  Alemtuzumab 12 MG/1.2ML SOLN Inject 12 mg into the vein.    [provider]  Alpha Lipoic Acid 200 MG CAPS Take 2 tablets daily 08/12/15   Sater, Pearletha Furl, MD  amantadine (SYMMETREL) 100 MG capsule TAKE 1 CAPSULE(100 MG) BY MOUTH TWICE DAILY 02/07/22   Sater, Pearletha Furl, MD  Cholecalciferol (VITAMIN D3) 5000 UNITS CAPS Take 1 capsule (5,000 Units total) by mouth daily. 08/11/15   Sater, Pearletha Furl, MD  Cladribine, 5 Tabs, (MAVENCLAD, 5 TABS,) 10 MG TBPK Take by mouth. Started 09/17/22-M1Y1. Take 10mg  daily on days 1-5 on month one. Take 10mg  daily on days 1-5 one month later.    Sater, , MD  co-enzyme Q-10 50 MG capsule Take 50 mg by mouth daily.    [provider]  diazepam (VALIUM) 5 MG tablet TAKE 1 TABLET(5 MG) BY MOUTH AT BEDTIME AS NEEDED 05/08/22    Sater, Pearletha Furl, MD  EVENING PRIMROSE OIL PO Take 1 Dose by mouth daily.     [provider]  imipramine (TOFRANIL) 25 MG tablet TAKE 1 TO 2 TABLETS(25 TO 50 MG) BY MOUTH AT BEDTIME 02/07/22   Sater, Pearletha Furl, MD  metoprolol tartrate (LOPRESSOR) 50 MG tablet Take 50 mg by mouth daily. Takes 2-25mg  every morning    [provider]  mirabegron ER (MYRBETRIQ) 50 MG TB24 tablet Take 1 tablet (50 mg total) by mouth daily. 05/08/22   Sater, Pearletha Furl, MD  Omeprazole (PRILOSEC PO) Take by mouth.    [provider]  oxybutynin (DITROPAN XL) 15 MG 24 hr tablet TAKE 1 TABLET BY MOUTH EVERYDAY AT BEDTIME 02/07/22   Sater, Pearletha Furl, MD  oxyCODONE-acetaminophen (PERCOCET/ROXICET) 5-325 MG tablet Take 1 tablet by mouth 2 (two) times daily. 09/24/19   Sater, Pearletha Furl, MD  rOPINIRole (REQUIP) 1 MG tablet Take 2 tablets (2 mg total) by mouth at bedtime. 10/03/22   Sater, Pearletha Furl, MD  tiZANidine (ZANAFLEX) 4 MG tablet TAKE 1 TABLET BY MOUTH 3 TIMES DAILY. 02/07/22   Sater, Pearletha Furl, MD  UNABLE TO FIND Take 1 capsule by mouth daily. Essential Amino Acids    [provider]      Allergies    Patient has no  known allergies.    Review of Systems   Review of Systems  Physical Exam Updated Vital Signs BP 101/75 (BP Location: Left Arm)   Pulse 83   Temp 97.6 F (36.4 C) (Oral)   Resp 18   SpO2 99%  Physical Exam Vitals and nursing note reviewed.  Constitutional:      General: She is not in acute distress.    Appearance: Normal appearance. She is not ill-appearing.  HENT:     Head: Normocephalic and atraumatic.     Mouth/Throat:     Mouth: Mucous membranes are moist.  Eyes:     General: No scleral icterus.       Right eye: No discharge.        Left eye: No discharge.     Conjunctiva/sclera: Conjunctivae normal.  Pulmonary:     Effort: Pulmonary effort is normal.     Breath sounds: No stridor.  Musculoskeletal:     Comments: Bruising of proximal phalanx of pinky  finger tenderness at the prox phalanx.  Full range of motion of wrist and no tenderness here.  No tenderness or restricted range of motion of any other fingers besides pinky finger.  Neurological:     Mental Status: She is alert and oriented to person, place, and time. Mental status is at baseline.  Psychiatric:        Mood and Affect: Mood normal.        Behavior: Behavior normal.     ED Results / Procedures / Treatments   Labs (all labs ordered are listed, but only abnormal results are displayed) Labs Reviewed - No data to display  EKG None  Radiology DG Finger Little Right  Result Date: 10/23/2022 CLINICAL DATA:  Status post fall with finger pain. EXAM: RIGHT LITTLE FINGER 2+V COMPARISON:  None Available. FINDINGS: Three-view exam shows a transverse fracture through the proximal metaphysis of the proximal phalanx. There is mild apex radial and anterior angulation at the fracture site with approximately 2 mm of radial displacement of the distal fracture fragment. IMPRESSION: Transverse angulated fracture involving the proximal phalanx of the pinky finger. Electronically Signed   By: Kennith Center M.D.   On: 10/23/2022 13:42    Procedures Reduction of fracture  Date/Time: 10/23/2022 5:18 PM  Performed by: Gailen Shelter, PA Authorized by: Gailen Shelter, PA  Consent: Verbal consent obtained. Risks and benefits: risks, benefits and alternatives were discussed Consent given by: patient Patient understanding: patient states understanding of the procedure being performed Patient consent: the patient's understanding of the procedure matches consent given Relevant documents: relevant documents present and verified Test results: test results available and properly labeled Imaging studies: imaging studies available Patient identity confirmed: verbally with patient and arm band Local anesthesia used: no  Anesthesia: Local anesthesia used: no  Sedation: Patient sedated:  no  Patient tolerance: patient tolerated the procedure well with no immediate complications Comments: Right little finger with proximal angulated phalanx fracture.  Patient is distally neurovascular intact.  The block was used and was successful.  Distraction and reduction was applied.  Patient is finger with better alignment.  Cap refill remains less than 2 seconds       Medications Ordered in ED Medications  lidocaine (PF) (XYLOCAINE) 1 % injection 10 mL (10 mLs Infiltration Given by Other 10/23/22 1527)    ED Course/ Medical Decision Making/ A&P  Medical Decision Making Amount and/or Complexity of Data Reviewed Radiology: ordered.  Risk Prescription drug management.   Patient is a 51 year old female with a past medical history significant for MS  She has a history of frequent falls that resulted for her MS.  She states she fell 2 days ago and out of bed and landed on her right pinky finger strangely causing some pain.  She states that she did not strike her head or lose consciousness denies any nausea vomiting confusion slurred speech, lightheadedness or dizziness.  No other associated areas of pain.  She has not had any pain in her wrist or hand other than in her right pinky finger.  Her sensation is normal which is somewhat decreased from her left side but this is normal for her.  I did obtain an x-ray of her right little finger which shows a proximal phalanx fracture.  This is somewhat angulated.  She decision-making overstates with patient.  She is agreeable to digital block and manipulation to try to get better alignment.  I discussed this case with my attending physician who cosigned this note.  Cap refill less than 2 seconds before and after reduction.  Patient tolerated procedure and digital block well  Finger with somewhat better alignment after reduction.  We will hold off on additional x-ray given the patient has improved exam.  Return  precautions discussed ultimately will follow-up with hand surgery.   Final Clinical Impression(s) / ED Diagnoses Final diagnoses:  Closed displaced fracture of proximal phalanx of right little finger, initial encounter    Rx / DC Orders ED Discharge Orders     None         Gailen Shelter, [place] 10/23/22 1719    Rexford Maus, DO 10/25/22 [phone]

## 2022-10-29 ENCOUNTER — Encounter: Payer: Self-pay | Admitting: Neurology

## 2022-10-30 ENCOUNTER — Ambulatory Visit: Payer: Medicare HMO | Admitting: Neurology

## 2022-10-30 NOTE — Telephone Encounter (Signed)
Faxed back signed clearance form to [facility] clearing pt neurologically/low risk at [phone], received fax confirmation. (tried [phone] but failedx2).

## 2022-11-05 ENCOUNTER — Other Ambulatory Visit: Payer: Self-pay | Admitting: Neurology

## 2022-11-17 ENCOUNTER — Other Ambulatory Visit: Payer: Self-pay | Admitting: Neurology

## 2022-11-20 ENCOUNTER — Encounter: Payer: Self-pay | Admitting: Neurology

## 2022-11-20 ENCOUNTER — Ambulatory Visit: Payer: Medicare HMO | Admitting: Neurology

## 2022-11-20 VITALS — BP 94/71 | HR 88 | Ht 65.0 in | Wt 135.0 lb

## 2022-11-20 DIAGNOSIS — G35 Multiple sclerosis: Secondary | ICD-10-CM

## 2022-11-20 DIAGNOSIS — R269 Unspecified abnormalities of gait and mobility: Secondary | ICD-10-CM | POA: Diagnosis not present

## 2022-11-20 DIAGNOSIS — Z79899 Other long term (current) drug therapy: Secondary | ICD-10-CM | POA: Diagnosis not present

## 2022-11-20 DIAGNOSIS — M47816 Spondylosis without myelopathy or radiculopathy, lumbar region: Secondary | ICD-10-CM

## 2022-11-20 DIAGNOSIS — R5383 Other fatigue: Secondary | ICD-10-CM

## 2022-11-20 DIAGNOSIS — R39198 Other difficulties with micturition: Secondary | ICD-10-CM

## 2022-11-20 MED ORDER — OXYCODONE-ACETAMINOPHEN 5-325 MG PO TABS: 1.0000 | ORAL_TABLET | Freq: Two times a day (BID) | ORAL | 0 refills | Status: AC | PRN

## 2022-11-20 NOTE — Progress Notes (Signed)
[facility]  PATIENT: Katherine Cameron DOB: [birth year]  REFERRING CLINICIAN: Hedgecock, PA-C HISTORY FROM: Patient with input form husband  REASON FOR VISIT: MS and spastic gait   HISTORICAL  CHIEF COMPLAINT:  Chief Complaint  Patient presents with   Follow-up    Pt in room #11 with her husband. Pt here to f/u with her MS. Pt requesting Rx refills.    HISTORY OF PRESENT ILLNESS:  Katherine Cameron is a 51 year old woman with active secondary progressive MS    Update 11/20/2022: She fell and broke the right 5th finger 10/23/2022.   She was up in the middle of the night to use the bathroom and fell landing on her right hand.     She had the two years of [place] in 2018 and April 2019.  She ihas completed the 4 years of  the REMS program and the REMS program.   She did [facility] October and November 2023 and she tolerated it very well.    She has had no exacerrbations but has noted mild progression.     Labs were ok except mild increased TSH   She saw endocrinology.  She is able to go 10 feet with a walker if well rested but this has worsened.    She has a folding power wheelchair and likes it better than the scooter.   She has weakness in both legs, right greater than left, she also notes reduced fine movements in the right hand.  She has falls.   She notes reduced right hand coordination and fine motor skills.   She is right handed so has trouble writing.      She  has urgency but no incontinence.   Oxybutynin helps but dries her mouth.  Myrbetriq was not covered well so she never took.   Vesicare and flomax had not helped as muchin past.    She will be doing a colonoscopy (strong FH).     Her MS has been stable but she notes more LBP.   She notes more spasticity in her right leg, especially if cold or at night.    She takes tizanidine and MJ every night and occasionally valium if having trouble sleeping due to the spasticity.    She has had a mild tremor in her head >  hands over last 6 months.   She takes propranolol and is not sure it helps the tremor  Amantadine has helped fatigue.    She notes more cognitive issues.   Verbal processing and fluency are reduced.   She is forgetful.    She has reduced focus/attention.  Provigil did not help much.   Ritalin helped some but caused tachycardia.      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  CBD helps some.  She saw a spine surgeon (Dr. Sherlyn Lick) and was advised to wait/monitor.   She leans forward while walking.   She sleeps in a fetal position.  Pain is actually down a bit better since the last visit.  She is sleeping much better with combination of ropinirole and imipramine.          MS History:  She presented with right optic neuritis. She went to an ophthalmologist and then was referred to neurology where she had an MRI of the brain performed.  The MRI was consistent with MS. She did not need to have a lumbar puncture. This was before FDA approved medications and she had several courses of IV steroids. Around 1994, she started Avonex after a large exacerbation around 2008, she went on Tysabri. She was more stable on Tysabri. Unfortunately, she had a a high titer JCV antibody. She was on Tysabri for about 3 years and then was switched to Tecfidera. She has been on Tecfidera for about 3 years. During the transition from Tysabri to [place], she had an especially severe exacerbation with significant worsening of her gait. A new focus was noted in the cervical spine.   She switched to [place] in 2018 and had her second year April 2019.  She is compliant with the rems program   Imaging: MRI of the brain 05/24/2020 shows multiple T2/FLAIR hyperintense foci in the hemispheres, cerebellum, thalamus and brainstem in a pattern and configuration  consistent with chronic demyelinating plaque associated with multiple sclerosis.  None of the foci enhance or appear to be acute.  Compared to the MRI dated 04/17/2019, there are no new lesions.   There is a normal enhancement pattern.  No acute findings.  MRI of the thoracic spine 05/24/2020 shows T2 hyperintense signal within the spinal cord consistent with chronic demyelinating plaque associated with multiple sclerosis.   No significant disc degenerative changes and spondylosis is noted.  There is no nerve root compression or spinal stenosis.  MRI of the cervical spine 05/24/2020 shows T2 hyperintense signal changes noted throughout the cervical spinal cord.  This appears unchanged compared to the 04/17/2019 MRI.  Additional foci are noted in the cerebellum and brainstem.Marland Kitchen   Spinal cord atrophy is noted, also unchanged.   Multilevel degenerative changes as detailed above that do not lead to nerve root compression or spinal stenosis.  MRI of the lumbar spine 08/15/2019 shows severe facet arthrosis at L4-5 and L5-S1 with resultant grade 1 anterolisthesis at the lower level. Mass effect on the descending left L5 nerve root secondary to synovial cyst arising from the facet joint at L4-5, unchanged.  REVIEW OF SYSTEMS:  Constitutional: No fevers, chills, sweats, or change in appetite.  She has insomnia Eyes: No visual changes, double vision, eye pain Ear, nose and throat: No hearing loss, ear pain, nasal congestion, sore throat Cardiovascular: No chest pain, palpitations Respiratory:  No shortness of breath at rest or with exertion.   No wheezes GastrointestinaI: No nausea, vomiting, diarrhea, abdominal pain, fecal incontinence Genitourinary:  No dysuria, urinary retention or frequency.  No nocturia. Musculoskeletal:  No neck pain,She has left > right hip pain.  Lower back pain Integumentary: No rash, pruritus, skin lesions Neurological: as above Psychiatric: No depression at this time.  No  anxiety Endocrine: No palpitations, diaphoresis, change in appetite, change in weigh or increased thirst Hematologic/Lymphatic:  No anemia, purpura, petechiae. Allergic/Immunologic: No itchy/runny eyes, nasal congestion, recent allergic reactions, rashes  ALLERGIES: No Known Allergies  HOME MEDICATIONS: Outpatient Medications Prior to Visit  Medication Sig Dispense Refill   acetaminophen (TYLENOL) 500 MG tablet Take 2 tablets (1,000 mg total) by mouth every 8 (eight) hours as needed for moderate pain. 90 tablet 5   Alemtuzumab 12 MG/1.2ML SOLN Inject 12 mg into the vein.     Alpha Lipoic Acid 200 MG CAPS Take 2 tablets daily 60 capsule 11   amantadine (SYMMETREL) 100 MG capsule TAKE 1 CAPSULE(100 MG) BY MOUTH TWICE DAILY 180 capsule 2   Cholecalciferol (VITAMIN D3) 5000 UNITS CAPS  Take 1 capsule (5,000 Units total) by mouth daily. 90 capsule 3   Cladribine, 5 Tabs, (MAVENCLAD, 5 TABS,) 10 MG TBPK Take by mouth. Started 09/17/22-M1Y1. Take 10mg  daily on days 1-5 on month one. Take 10mg  daily on days 1-5 one month later.     co-enzyme Q-10 50 MG capsule Take 50 mg by mouth daily.     diazepam (VALIUM) 5 MG tablet TAKE 1 TABLET(5 MG) BY MOUTH AT BEDTIME AS NEEDED 30 tablet 0   EVENING PRIMROSE OIL PO Take 1 Dose by mouth daily.      imipramine (TOFRANIL) 25 MG tablet TAKE 1 TO 2 TABLETS(25 TO 50 MG) BY MOUTH AT BEDTIME 180 tablet 2   Omeprazole (PRILOSEC PO) Take by mouth.     oxybutynin (DITROPAN XL) 15 MG 24 hr tablet TAKE 1 TABLET BY MOUTH EVERYDAY AT BEDTIME 90 tablet 2   propranolol (INDERAL) 20 MG tablet Take 20 mg by mouth 3 (three) times daily.     rOPINIRole (REQUIP) 1 MG tablet Take 2 tablets (2 mg total) by mouth at bedtime. 180 tablet 2   tiZANidine (ZANAFLEX) 4 MG tablet TAKE 1 TABLET BY MOUTH 3 TIMES DAILY. 270 tablet 2   UNABLE TO FIND Take 1 capsule by mouth daily. Essential Amino Acids     oxyCODONE-acetaminophen (PERCOCET/ROXICET) 5-325 MG tablet Take 1 tablet by mouth 2  (two) times daily. 60 tablet 0   metoprolol tartrate (LOPRESSOR) 50 MG tablet Take 50 mg by mouth daily. Takes 2-25mg  every morning (Patient not taking: Reported on 11/20/2022)     mirabegron ER (MYRBETRIQ) 50 MG TB24 tablet Take 1 tablet (50 mg total) by mouth daily. (Patient not taking: Reported on 11/20/2022) 30 tablet 11   No facility-administered medications prior to visit.  also on Tecfidera 240 mg bid, imipramine 25 mg at night and Ritalin 20 mg once or twice a day  PAST MEDICAL HISTORY: Past Medical History:  Diagnosis Date   Movement disorder    Multiple sclerosis (HCC)    Neuropathy    Pneumothorax, spontaneous, tension    Left x 2   Vision abnormalities     PAST SURGICAL HISTORY: Past Surgical History:  Procedure Laterality Date   PLEURAL SCARIFICATION     collasped lung     FAMILY HISTORY: Family History  Problem Relation Age of Onset   Hypertension Mother    Hyperlipidemia Mother    Colon cancer Mother    COPD Father    Multiple sclerosis Sister     SOCIAL HISTORY:  Social History   Socioeconomic History   Marital status: Married    Spouse name: Not on file   Number of children: Not on file   Years of education: Not on file   Highest education level: Not on file  Occupational History   Not on file  Tobacco Use   Smoking status: Former    Types: Cigarettes   Smokeless tobacco: Never  Vaping Use   Vaping Use: Never used  Substance and Sexual Activity   Alcohol use: No    Alcohol/week: 0.0 standard drinks of alcohol   Drug use: No   Sexual activity: Not on file  Other Topics Concern   Not on file  Social History Narrative   Not on file   Social Determinants of Health   Financial Resource Strain: Not on file  Food Insecurity: Not on file  Transportation Needs: Not on file  Physical Activity: Not on file  Stress: Not on file  Social Connections: Not on file  Intimate Partner Violence: Not on file     PHYSICAL EXAM  Vitals:    11/20/22 1451  BP: 94/71  Pulse: 88  Weight: 135 lb (61.2 kg)  Height: 5\' 5"  (1.651 m)   Pulse rechecked and was 116.  Body mass index is 22.47 kg/m.   General: The patient is well-developed and well-nourished and in no acute distress   Neurologic Exam  Mental status: The patient is alert and oriented x 3 at the time of the examination. The patient has apparent normal recent and remote memory, with an apparently normal attention span and concentration ability.   Speech is normal.  Cranial nerves: Extraocular movements are full.  Facial strength s normal.. No dysarthria is noted.   No obvious hearing deficits are noted.  Motor: I did not appreciate a tremor today.  Muscle bulk is normal and tone is increased in the legs, right greater than left..   There is minimal right arm increased tone though strength was good in the arms.  Strength was 2-/5 on right and 3 to 4-/5 on the left  Sensory: Sensory testing is intact to touch and vibration sensation in all 4 extremities.  Coordination: Cerebellar testing reveals good finger-nose-finger on the left but mildly reduced rapid alternating movements and FTN in right hand.   Heel-to-shin was reduced on the left and she cannot do the right  Gait and station: can not stand without strong bilat support.     Reflexes: Deep tendon reflexes are increased, right > left.  She has increased reflexes at the knees.  She does not have ankle clonus.   DIAGNOSTIC DATA (LABS, IMAGING, TESTING) - I reviewed patient records, labs, notes, testing and imaging myself where available.  Lab Results  Component Value Date   WBC 7.2 09/24/2019   HGB 12.4 09/24/2019   HCT 36.7 09/24/2019   MCV 86 09/24/2019   PLT 295 09/24/2019       ASSESSMENT AND PLAN  Multiple sclerosis ([place]) - Plan: CBC with Differential/Platelet, Comprehensive metabolic panel  High risk medication use - Plan: CBC with Differential/Platelet, Comprehensive metabolic panel  Facet  syndrome, lumbar  Abnormal gait  Urinary dysfunction  Other fatigue  1.  Labs for [facility] - will prescrbe Valtrex if lymphocytes are 0.2 or less 2.   Her low back pain is stable and is likely coming from the degenerative changes at L4-L5 and L5-S1.  She has seen the orthopedic spine center but watchful waiting was advised due to her weakness and probability of slow recovery..  She also may benefit from an epidural steroid on the left L4-L5 or medial branch block/RFA and we will consider 1 of these if pain persists.. 3.  Continue tizanidine for spasticity.  Valium to help with spasticity and insomnia prn.   4.  Return in 6 months or sooner if there are new or worsening neurologic symptoms.   42-minute office visit with the majority of the time spent face-to-face for history and physical, discussion/counseling and decision-making.  Additional time with record review and documentation.  Elchonon Maxson A. Felecia Shelling, MD, PhD [id number], [id number] PM Certified in Neurology, Clinical Neurophysiology, Sleep Medicine, Pain Medicine and Neuroimaging  [facility] [address], [place],  [address] [phone]

## 2022-11-21 LAB — CBC WITH DIFFERENTIAL/PLATELET
Basophils Absolute: 0.1 10*3/uL (ref 0.0–0.2)
Basos: 1 %
EOS (ABSOLUTE): 0.5 10*3/uL — ABNORMAL HIGH (ref 0.0–0.4)
Eos: 9 %
Hematocrit: 38.2 % (ref 34.0–46.6)
Hemoglobin: 13 g/dL (ref 11.1–15.9)
Immature Grans (Abs): 0 10*3/uL (ref 0.0–0.1)
Immature Granulocytes: 1 %
Lymphocytes Absolute: 0.5 10*3/uL — ABNORMAL LOW (ref 0.7–3.1)
Lymphs: 8 %
MCH: 29.1 pg (ref 26.6–33.0)
MCHC: 34 g/dL (ref 31.5–35.7)
MCV: 86 fL (ref 79–97)
Monocytes Absolute: 0.7 10*3/uL (ref 0.1–0.9)
Monocytes: 11 %
Neutrophils Absolute: 4.4 10*3/uL (ref 1.4–7.0)
Neutrophils: 70 %
Platelets: 231 10*3/uL (ref 150–450)
RBC: 4.46 x10E6/uL (ref 3.77–5.28)
RDW: 14.5 % (ref 11.7–15.4)
WBC: 6.2 10*3/uL (ref 3.4–10.8)

## 2022-11-21 LAB — COMPREHENSIVE METABOLIC PANEL
ALT: 24 IU/L (ref 0–32)
AST: 21 IU/L (ref 0–40)
Albumin/Globulin Ratio: 1.9 (ref 1.2–2.2)
Albumin: 4.5 g/dL (ref 3.8–4.9)
Alkaline Phosphatase: 62 IU/L (ref 44–121)
BUN/Creatinine Ratio: 14 (ref 9–23)
BUN: 10 mg/dL (ref 6–24)
Bilirubin Total: 0.3 mg/dL (ref 0.0–1.2)
CO2: 27 mmol/L (ref 20–29)
Calcium: 10 mg/dL (ref 8.7–10.2)
Chloride: 100 mmol/L (ref 96–106)
Creatinine, Ser: 0.71 mg/dL (ref 0.57–1.00)
Globulin, Total: 2.4 g/dL (ref 1.5–4.5)
Glucose: 88 mg/dL (ref 70–99)
Potassium: 4.6 mmol/L (ref 3.5–5.2)
Sodium: 140 mmol/L (ref 134–144)
Total Protein: 6.9 g/dL (ref 6.0–8.5)
eGFR: 103 mL/min/{1.73_m2} (ref >59)

## 2023-01-23 ENCOUNTER — Other Ambulatory Visit: Payer: Self-pay | Admitting: Neurology

## 2023-01-23 NOTE — Telephone Encounter (Signed)
Pt last seen on 11/20/2022 Follow up scheduled on 05/15/23

## 2023-02-08 ENCOUNTER — Other Ambulatory Visit: Payer: Self-pay | Admitting: Neurology

## 2023-02-11 MED ORDER — DIAZEPAM 5 MG PO TABS: ORAL_TABLET | ORAL | 0 refills | Status: DC

## 2023-03-11 NOTE — Progress Notes (Unsigned)
[facility]  PATIENT: Katherine Cameron DOB: [birth year]  REFERRING CLINICIAN: Hedgecock, PA-C HISTORY FROM: Patient with input form husband  REASON FOR VISIT: MS and spastic gait   HISTORICAL  CHIEF COMPLAINT:  Chief Complaint  Patient presents with   Follow-up    Pt in room #11 with her husband. Pt here to f/u with her MS. Pt requesting Rx refills.    HISTORY OF PRESENT ILLNESS:  Katherine Cameron is a 52 year old woman with active secondary progressive MS    Update 11/20/2022: She fell and broke the right 5th finger 10/23/2022.   She was up in the middle of the night to use the bathroom and fell landing on her right hand.     She had the two years of [place] in 2018 and April 2019.  She ihas completed the 4 years of  the REMS program and the REMS program.   She did [facility] October and November 2023 and she tolerated it very well.    She has had no exacerrbations but has noted mild progression.     Labs were ok except mild increased TSH   She saw endocrinology.  She is able to go 10 feet with a walker if well rested but this has worsened.    She has a folding power wheelchair and likes it better than the scooter.   She has weakness in both legs, right greater than left, she also notes reduced fine movements in the right hand.  She has falls.   She notes reduced right hand coordination and fine motor skills.   She is right handed so has trouble writing.      She  has urgency but no incontinence.   Oxybutynin helps but dries her mouth.  Myrbetriq was not covered well so she never took.   Vesicare and flomax had not helped as muchin past.    She will be doing a colonoscopy (strong FH).     Her MS has been stable but she notes more LBP.   She notes more spasticity in her right leg, especially if cold or at night.    She takes tizanidine and MJ every night and occasionally valium if having trouble sleeping due to the spasticity.    She has had a mild tremor in her head >  hands over last 6 months.   She takes propranolol and is not sure it helps the tremor  Amantadine has helped fatigue.    She notes more cognitive issues.   Verbal processing and fluency are reduced.   She is forgetful.    She has reduced focus/attention.  Provigil did not help much.   Ritalin helped some but caused tachycardia.      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  CBD helps some.  She saw a spine surgeon (Dr. Sherlyn Lick) and was advised to wait/monitor.   She leans forward while walking.   She sleeps in a fetal position.  Pain is actually down a bit better since the last visit.  She is sleeping much better with combination of ropinirole and imipramine.          MS History:  She presented with right optic neuritis. She went to an ophthalmologist and then was referred to neurology where she had an MRI of the brain performed.  The MRI was consistent with MS. She did not need to have a lumbar puncture. This was before FDA approved medications and she had several courses of IV steroids. Around 1994, she started Avonex after a large exacerbation around 2008, she went on Tysabri. She was more stable on Tysabri. Unfortunately, she had a a high titer JCV antibody. She was on Tysabri for about 3 years and then was switched to Tecfidera. She has been on Tecfidera for about 3 years. During the transition from Tysabri to Vandervoortecfidera, she had an especially severe exacerbation with significant worsening of her gait. A new focus was noted in the cervical spine.   She switched to [place] in 2018 and had her second year April 2019.  She is compliant with the rems program   Imaging: MRI of the brain 07/17/2022 shows Multiple T2/FLAIR hyperintense foci in the brainstem, middle cerebellar peduncles, thalamus and cerebral hemispheres in a pattern  consistent with chronic demyelinating plaque associated with multiple sclerosis.  None of the foci appear to be acute.  No enhancing lesions.  Compared to the MRI from 05/24/2020, there are no new lesions.  Mild generalized cortical atrophy.  MRI of the lumbar spine 07/17/2022 shows At L4-L5, there is severe facet hypertrophy and a left synovial cyst causing left lateral recess stenosis that could lead to left L5 nerve root compression.  There is mild foraminal narrowing but no impingement upon the L4 nerve roots.  The synovial cyst is the same or slightly smaller compared to the 2021 MRI.    At L5-S1, there is anterolisthesis due to facet hypertrophy and pars defect.  No nerve root compression at this level.  MRI of the brain 05/24/2020 shows multiple T2/FLAIR hyperintense foci in the hemispheres, cerebellum, thalamus and brainstem in a pattern and configuration consistent with chronic demyelinating plaque associated with multiple sclerosis.  None of the foci enhance or appear to be acute.  Compared to the MRI dated 04/17/2019, there are no new lesions.   There is a normal enhancement pattern.  No acute findings.  MRI of the thoracic spine 05/24/2020 shows T2 hyperintense signal within the spinal cord consistent with chronic demyelinating plaque associated with multiple sclerosis.   No significant disc degenerative changes and spondylosis is noted.  There is no nerve root compression or spinal stenosis.  MRI of the cervical spine 05/24/2020 shows T2 hyperintense signal changes noted throughout the cervical spinal cord.  This appears unchanged compared to the 04/17/2019 MRI.  Additional foci are noted in the cerebellum and brainstem.Marland Kitchen.   Spinal cord atrophy is noted, also unchanged.   Multilevel degenerative changes as detailed above that do not lead to nerve root compression or spinal stenosis.  MRI of the lumbar spine 08/15/2019 shows severe facet arthrosis at L4-5 and L5-S1 with resultant grade 1  anterolisthesis at the lower level. Mass effect on the descending left L5 nerve root secondary to synovial cyst arising from the facet joint at L4-5, unchanged.  REVIEW OF SYSTEMS:  Constitutional: No fevers, chills, sweats, or change in appetite.  She has insomnia Eyes: No visual changes, double vision, eye pain Ear, nose and throat: No hearing loss, ear pain, nasal congestion, sore throat Cardiovascular: No chest pain, palpitations Respiratory:  No shortness of breath at rest or with exertion.   No wheezes GastrointestinaI: No nausea, vomiting, diarrhea, abdominal pain, fecal incontinence Genitourinary:  No dysuria, urinary retention or frequency.  No nocturia. Musculoskeletal:  No neck pain,She has left > right hip pain.  Lower  back pain Integumentary: No rash, pruritus, skin lesions Neurological: as above Psychiatric: No depression at this time.  No anxiety Endocrine: No palpitations, diaphoresis, change in appetite, change in weigh or increased thirst Hematologic/Lymphatic:  No anemia, purpura, petechiae. Allergic/Immunologic: No itchy/runny eyes, nasal congestion, recent allergic reactions, rashes  ALLERGIES: No Known Allergies  HOME MEDICATIONS: Outpatient Medications Prior to Visit  Medication Sig Dispense Refill   acetaminophen (TYLENOL) 500 MG tablet Take 2 tablets (1,000 mg total) by mouth every 8 (eight) hours as needed for moderate pain. 90 tablet 5   Alemtuzumab 12 MG/1.2ML SOLN Inject 12 mg into the vein.     Alpha Lipoic Acid 200 MG CAPS Take 2 tablets daily 60 capsule 11   amantadine (SYMMETREL) 100 MG capsule TAKE 1 CAPSULE(100 MG) BY MOUTH TWICE DAILY 180 capsule 2   Cholecalciferol (VITAMIN D3) 5000 UNITS CAPS Take 1 capsule (5,000 Units total) by mouth daily. 90 capsule 3   Cladribine, 5 Tabs, (MAVENCLAD, 5 TABS,) 10 MG TBPK Take by mouth. Started 09/17/22-M1Y1. Take 10mg  daily on days 1-5 on month one. Take 10mg  daily on days 1-5 one month later.     co-enzyme  Q-10 50 MG capsule Take 50 mg by mouth daily.     diazepam (VALIUM) 5 MG tablet TAKE 1 TABLET(5 MG) BY MOUTH AT BEDTIME AS NEEDED 30 tablet 0   EVENING PRIMROSE OIL PO Take 1 Dose by mouth daily.      imipramine (TOFRANIL) 25 MG tablet TAKE 1 TO 2 TABLETS(25 TO 50 MG) BY MOUTH AT BEDTIME 180 tablet 2   Omeprazole (PRILOSEC PO) Take by mouth.     oxybutynin (DITROPAN XL) 15 MG 24 hr tablet TAKE 1 TABLET BY MOUTH EVERYDAY AT BEDTIME 90 tablet 2   propranolol (INDERAL) 20 MG tablet Take 20 mg by mouth 3 (three) times daily.     rOPINIRole (REQUIP) 1 MG tablet Take 2 tablets (2 mg total) by mouth at bedtime. 180 tablet 2   tiZANidine (ZANAFLEX) 4 MG tablet TAKE 1 TABLET BY MOUTH 3 TIMES DAILY. 270 tablet 2   UNABLE TO FIND Take 1 capsule by mouth daily. Essential Amino Acids     oxyCODONE-acetaminophen (PERCOCET/ROXICET) 5-325 MG tablet Take 1 tablet by mouth 2 (two) times daily. 60 tablet 0   metoprolol tartrate (LOPRESSOR) 50 MG tablet Take 50 mg by mouth daily. Takes 2-25mg  every morning (Patient not taking: Reported on 11/20/2022)     mirabegron ER (MYRBETRIQ) 50 MG TB24 tablet Take 1 tablet (50 mg total) by mouth daily. (Patient not taking: Reported on 11/20/2022) 30 tablet 11   No facility-administered medications prior to visit.  also on Tecfidera 240 mg bid, imipramine 25 mg at night and Ritalin 20 mg once or twice a day  PAST MEDICAL HISTORY: Past Medical History:  Diagnosis Date   Movement disorder    Multiple sclerosis (HCC)    Neuropathy    Pneumothorax, spontaneous, tension    Left x 2   Vision abnormalities     PAST SURGICAL HISTORY: Past Surgical History:  Procedure Laterality Date   PLEURAL SCARIFICATION     collasped lung     FAMILY HISTORY: Family History  Problem Relation Age of Onset   Hypertension Mother    Hyperlipidemia Mother    Colon cancer Mother    COPD Father    Multiple sclerosis Sister     SOCIAL HISTORY:  Social History   Socioeconomic  History   Marital status: Married  Spouse name: Not on file   Number of children: Not on file   Years of education: Not on file   Highest education level: Not on file  Occupational History   Not on file  Tobacco Use   Smoking status: Former    Types: Cigarettes   Smokeless tobacco: Never  Vaping Use   Vaping Use: Never used  Substance and Sexual Activity   Alcohol use: No    Alcohol/week: 0.0 standard drinks of alcohol   Drug use: No   Sexual activity: Not on file  Other Topics Concern   Not on file  Social History Narrative   Not on file   Social Determinants of Health   Financial Resource Strain: Not on file  Food Insecurity: Not on file  Transportation Needs: Not on file  Physical Activity: Not on file  Stress: Not on file  Social Connections: Not on file  Intimate Partner Violence: Not on file     PHYSICAL EXAM  Vitals:   11/20/22 1451  BP: 94/71  Pulse: 88  Weight: 135 lb (61.2 kg)  Height: 5\' 5"  (1.651 m)   Pulse rechecked and was 116.  Body mass index is 22.47 kg/m.   General: The patient is well-developed and well-nourished and in no acute distress   Neurologic Exam  Mental status: The patient is alert and oriented x 3 at the time of the examination. The patient has apparent normal recent and remote memory, with an apparently normal attention span and concentration ability.   Speech is normal.  Cranial nerves: Extraocular movements are full.  Facial strength s normal.. No dysarthria is noted.   No obvious hearing deficits are noted.  Motor: I did not appreciate a tremor today.  Muscle bulk is normal and tone is increased in the legs, right greater than left..   There is minimal right arm increased tone though strength was good in the arms.  Strength was 2-/5 on right and 3 to 4-/5 on the left  Sensory: Sensory testing is intact to touch and vibration sensation in all 4 extremities.  Coordination: Cerebellar testing reveals good  finger-nose-finger on the left but mildly reduced rapid alternating movements and FTN in right hand.   Heel-to-shin was reduced on the left and she cannot do the right  Gait and station: can not stand without strong bilat support.     Reflexes: Deep tendon reflexes are increased, right > left.  She has increased reflexes at the knees.  She does not have ankle clonus.   DIAGNOSTIC DATA (LABS, IMAGING, TESTING) - I reviewed patient records, labs, notes, testing and imaging myself where available.  Lab Results  Component Value Date   WBC 7.2 09/24/2019   HGB 12.4 09/24/2019   HCT 36.7 09/24/2019   MCV 86 09/24/2019   PLT 295 09/24/2019       ASSESSMENT AND PLAN  Multiple sclerosis (HCC) - Plan: CBC with Differential/Platelet, Comprehensive metabolic panel  High risk medication use - Plan: CBC with Differential/Platelet, Comprehensive metabolic panel  Facet syndrome, lumbar  Abnormal gait  Urinary dysfunction  Other fatigue  1.  Labs for [facility] - will prescrbe Valtrex if lymphocytes are 0.2 or less 2.   Her low back pain is stable and is likely coming from the degenerative changes at L4-L5 and L5-S1.  She has seen the orthopedic spine center but watchful waiting was advised due to her weakness and probability of slow recovery..  She also may benefit from an epidural steroid  on the left L4-L5 or medial branch block/RFA and we will consider 1 of these if pain persists.. 3.  Continue tizanidine for spasticity.  Valium to help with spasticity and insomnia prn.   4.  Return in 6 months or sooner if there are new or worsening neurologic symptoms.   42-minute office visit with the majority of the time spent face-to-face for history and physical, discussion/counseling and decision-making.  Additional time with record review and documentation.  Travin Marik A. Epimenio Foot, MD, PhD 11/20/2022, 4:49 PM Certified in Neurology, Clinical Neurophysiology, Sleep Medicine, Pain Medicine and  Neuroimaging  [facility] [address], [address] [place], [place] [address] [phone]

## 2023-03-12 ENCOUNTER — Ambulatory Visit: Payer: Medicare HMO | Admitting: Neurology

## 2023-03-12 ENCOUNTER — Encounter: Payer: Self-pay | Admitting: Neurology

## 2023-03-12 VITALS — BP 100/70 | HR 77 | Ht 65.0 in | Wt 134.0 lb

## 2023-03-12 DIAGNOSIS — G4700 Insomnia, unspecified: Secondary | ICD-10-CM

## 2023-03-12 DIAGNOSIS — G35 Multiple sclerosis: Secondary | ICD-10-CM

## 2023-03-12 DIAGNOSIS — R39198 Other difficulties with micturition: Secondary | ICD-10-CM

## 2023-03-12 DIAGNOSIS — R269 Unspecified abnormalities of gait and mobility: Secondary | ICD-10-CM

## 2023-03-12 DIAGNOSIS — R5383 Other fatigue: Secondary | ICD-10-CM

## 2023-03-12 DIAGNOSIS — Z79899 Other long term (current) drug therapy: Secondary | ICD-10-CM

## 2023-03-12 DIAGNOSIS — M47816 Spondylosis without myelopathy or radiculopathy, lumbar region: Secondary | ICD-10-CM | POA: Diagnosis not present

## 2023-03-12 MED ORDER — BACLOFEN 10 MG PO TABS: 10.0000 mg | ORAL_TABLET | Freq: Every day | ORAL | 11 refills | Status: DC

## 2023-03-13 LAB — COMPREHENSIVE METABOLIC PANEL
ALT: 14 IU/L (ref 0–32)
AST: 15 IU/L (ref 0–40)
Albumin/Globulin Ratio: 1.7 (ref 1.2–2.2)
Albumin: 4.5 g/dL (ref 3.8–4.9)
Alkaline Phosphatase: 59 IU/L (ref 44–121)
BUN/Creatinine Ratio: 20 (ref 9–23)
BUN: 15 mg/dL (ref 6–24)
Bilirubin Total: 0.3 mg/dL (ref 0.0–1.2)
CO2: 25 mmol/L (ref 20–29)
Calcium: 9.7 mg/dL (ref 8.7–10.2)
Chloride: 103 mmol/L (ref 96–106)
Creatinine, Ser: 0.74 mg/dL (ref 0.57–1.00)
Globulin, Total: 2.7 g/dL (ref 1.5–4.5)
Glucose: 89 mg/dL (ref 70–99)
Potassium: 4.9 mmol/L (ref 3.5–5.2)
Sodium: 142 mmol/L (ref 134–144)
Total Protein: 7.2 g/dL (ref 6.0–8.5)
eGFR: 98 mL/min/{1.73_m2} (ref >59)

## 2023-03-13 LAB — CBC WITH DIFFERENTIAL/PLATELET
Basophils Absolute: 0.1 10*3/uL (ref 0.0–0.2)
Basos: 1 %
EOS (ABSOLUTE): 0.2 10*3/uL (ref 0.0–0.4)
Eos: 3 %
Hematocrit: 42 % (ref 34.0–46.6)
Hemoglobin: 13.5 g/dL (ref 11.1–15.9)
Immature Grans (Abs): 0 10*3/uL (ref 0.0–0.1)
Immature Granulocytes: 0 %
Lymphocytes Absolute: 1.1 10*3/uL (ref 0.7–3.1)
Lymphs: 17 %
MCH: 28 pg (ref 26.6–33.0)
MCHC: 32.1 g/dL (ref 31.5–35.7)
MCV: 87 fL (ref 79–97)
Monocytes Absolute: 0.5 10*3/uL (ref 0.1–0.9)
Monocytes: 7 %
Neutrophils Absolute: 4.4 10*3/uL (ref 1.4–7.0)
Neutrophils: 72 %
Platelets: 258 10*3/uL (ref 150–450)
RBC: 4.82 x10E6/uL (ref 3.77–5.28)
RDW: 12.9 % (ref 11.7–15.4)
WBC: 6.2 10*3/uL (ref 3.4–10.8)

## 2023-05-15 ENCOUNTER — Ambulatory Visit: Payer: Medicare HMO | Admitting: Neurology

## 2023-05-28 ENCOUNTER — Encounter: Payer: Self-pay | Admitting: Neurology

## 2023-07-03 ENCOUNTER — Other Ambulatory Visit: Payer: Self-pay | Admitting: Neurology

## 2023-07-03 ENCOUNTER — Other Ambulatory Visit: Payer: Self-pay

## 2023-07-04 ENCOUNTER — Other Ambulatory Visit: Payer: Self-pay | Admitting: Neurology

## 2023-07-04 NOTE — Telephone Encounter (Signed)
Last seen on 03/12/23 Follow up scheduled on 08/14/23

## 2023-07-09 ENCOUNTER — Other Ambulatory Visit: Payer: Self-pay | Admitting: Neurology

## 2023-07-10 MED ORDER — AMANTADINE HCL 100 MG PO CAPS: ORAL_CAPSULE | ORAL | 0 refills | Status: DC

## 2023-07-10 NOTE — Telephone Encounter (Signed)
Rx was sent on 07/04/23 however "E-Prescribing Status: Transmission to pharmacy failed (07/04/2023  4:00 PM EDT) "  Rx re-sent

## 2023-07-22 ENCOUNTER — Other Ambulatory Visit: Payer: Self-pay | Admitting: Neurology

## 2023-07-22 MED ORDER — AMANTADINE HCL 100 MG PO CAPS: ORAL_CAPSULE | ORAL | 0 refills | Status: AC

## 2023-07-22 NOTE — Telephone Encounter (Signed)
Rx refilled per last office visit note.

## 2023-08-07 ENCOUNTER — Encounter: Payer: Self-pay | Admitting: Neurology

## 2023-08-07 ENCOUNTER — Other Ambulatory Visit: Payer: Self-pay | Admitting: Neurology

## 2023-08-08 ENCOUNTER — Other Ambulatory Visit: Payer: Self-pay

## 2023-08-08 MED ORDER — OXYBUTYNIN CHLORIDE ER 15 MG PO TB24: 15.0000 mg | ORAL_TABLET | Freq: Every day | ORAL | 0 refills | Status: DC

## 2023-08-14 ENCOUNTER — Ambulatory Visit: Payer: Medicare HMO | Admitting: Neurology

## 2023-08-14 ENCOUNTER — Encounter: Payer: Self-pay | Admitting: Neurology

## 2023-08-14 VITALS — BP 102/71 | HR 89 | Ht 65.5 in | Wt 142.4 lb

## 2023-08-14 DIAGNOSIS — Z79899 Other long term (current) drug therapy: Secondary | ICD-10-CM | POA: Diagnosis not present

## 2023-08-14 DIAGNOSIS — R269 Unspecified abnormalities of gait and mobility: Secondary | ICD-10-CM | POA: Diagnosis not present

## 2023-08-14 DIAGNOSIS — R39198 Other difficulties with micturition: Secondary | ICD-10-CM

## 2023-08-14 DIAGNOSIS — G35 Multiple sclerosis: Secondary | ICD-10-CM | POA: Diagnosis not present

## 2023-08-14 NOTE — Progress Notes (Signed)
[facility]  PATIENT: Katherine Cameron DOB: [birth year]  REFERRING CLINICIAN: Hedgecock, PA-C HISTORY FROM: Patient with input form husband   REASON FOR VISIT: MS and spastic gait   HISTORICAL  CHIEF COMPLAINT:  Chief Complaint  Patient presents with   Follow-up    Rm 10,  husband.  No concerns.  To start Mavenclad.      HISTORY OF PRESENT ILLNESS:  Katherine Cameron is a 52 year old woman with active secondary progressive MS    Update 08/14/2023  She did Mavenclad October and November 2023 and she tolerated it very well.    She has had no exacerrbations but has noted mild progression.     Mavenclad, she had the two years of [place] in 2018 and April 2019. Labs 11/20/2022 showed lymphocyte count was 0.5.   03/12/2023 labs showed count was 1.1.     She feels stable compared to last visit with no significant new neurologic symptoms. She is able to go 20-30 feet with a walker if well rested but then is wiped out.   Always worse lte afternoons and evenings..    She has a folding power wheelchair and likes it better than the scooter.  She has had some falls and broke finger 09/2022.   She has weakness in both legs, right greater than left, she also notes reduced fine movements in the right hand.  She has had some difficulty with handwriting.  She  has urinary urgency and incontinence.   She has trouble tolerating briefs that have any plastic.     Oxybutynin helps the urgency but dries her mouth.  Myrbetriq and Leslye Peer were not covered well so she never took.   Vesicare and flomax had not helped in the past.      She notes vision is reduced and has ophtho appt next week.    She is sleeping 3-4 good hours with ropinirole and imipramine.  She will sometimes take diazepam  Her MS has been stable but she notes more LBP.   She notes more spasticity in her right leg, especially if cold or at night.    She takes tizanidine and MJ every night and occasionally valium if having trouble  sleeping due to the spasticity.     She has had a mild tremor in her head > hands over last 6 months.   She takes propranolol and is not sure it helps the tremor  Amantadine 100 mg twice daily has helped fatigue.    She notes some cognitive issues.   Specifically, verbal processing and fluency are reduced.   She is forgetful.    She has reduced focus/attention.  Provigil did not help much.   Ritalin helped some but caused tachycardia so she discontinued.      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  She saw a spine surgeon (Dr. Precious Gilding) and was advised to wait/monitor.   She leans forward while walking.   She sleeps in a fetal position.  Pain is actually down a bit better since the last visit.Marland Kitchen   MRI in 2023 was stable compared to 2021.        MS History:  She presented with right optic neuritis. She went to an ophthalmologist and then was referred to neurology where she had an MRI of the brain performed. The  MRI was consistent with MS. She did not need to have a lumbar puncture. This was before FDA approved medications and she had several courses of IV steroids. Around 1994, she started Avonex after a large exacerbation around 2008, she went on Tysabri. She was more stable on Tysabri. Unfortunately, she had a a high titer JCV antibody. She was on Tysabri for about 3 years and then was switched to Tecfidera. She has been on Tecfidera for about 3 years. During the transition from Tysabri to [place], she had an especially severe exacerbation with significant worsening of her gait. A new focus was noted in the cervical spine.   She switched to [place] in 2018 and had her second year April 2019.  She is compliant with the rems program   Imaging: MRI of the brain 07/17/2022 shows Multiple T2/FLAIR hyperintense foci in the brainstem, middle  cerebellar peduncles, thalamus and cerebral hemispheres in a pattern consistent with chronic demyelinating plaque associated with multiple sclerosis.  None of the foci appear to be acute.  No enhancing lesions.  Compared to the MRI from 05/24/2020, there are no new lesions.  Mild generalized cortical atrophy.  MRI of the lumbar spine 07/17/2022 shows At L4-L5, there is severe facet hypertrophy and a left synovial cyst causing left lateral recess stenosis that could lead to left L5 nerve root compression.  There is mild foraminal narrowing but no impingement upon the L4 nerve roots.  The synovial cyst is the same or slightly smaller compared to the 2021 MRI.    At L5-S1, there is anterolisthesis due to facet hypertrophy and pars defect.  No nerve root compression at this level.  MRI of the brain 05/24/2020 shows multiple T2/FLAIR hyperintense foci in the hemispheres, cerebellum, thalamus and brainstem in a pattern and configuration consistent with chronic demyelinating plaque associated with multiple sclerosis.  None of the foci enhance or appear to be acute.  Compared to the MRI dated 04/17/2019, there are no new lesions.   There is a normal enhancement pattern.  No acute findings.  MRI of the thoracic spine 05/24/2020 shows T2 hyperintense signal within the spinal cord consistent with chronic demyelinating plaque associated with multiple sclerosis.   No significant disc degenerative changes and spondylosis is noted.  There is no nerve root compression or spinal stenosis.  MRI of the cervical spine 05/24/2020 shows T2 hyperintense signal changes noted throughout the cervical spinal cord.  This appears unchanged compared to the 04/17/2019 MRI.  Additional foci are noted in the cerebellum and brainstem.Marland Kitchen   Spinal cord atrophy is noted, also unchanged.   Multilevel degenerative changes as detailed above that do not lead to nerve root compression or spinal stenosis.  MRI of the lumbar spine 08/15/2019 shows severe  facet arthrosis at L4-5 and L5-S1 with resultant grade 1 anterolisthesis at the lower level. Mass effect on the descending left L5 nerve root secondary to synovial cyst arising from the facet joint at L4-5, unchanged.  REVIEW OF SYSTEMS:  Constitutional: No fevers, chills, sweats, or change in appetite.  She has insomnia Eyes: No visual changes, double vision, eye pain Ear, nose and throat: No hearing loss, ear pain, nasal congestion, sore throat Cardiovascular: No chest pain, palpitations Respiratory:  No shortness of breath at rest or with exertion.   No wheezes GastrointestinaI: No nausea, vomiting, diarrhea, abdominal pain, fecal incontinence Genitourinary:  No dysuria, urinary retention or frequency.  No nocturia. Musculoskeletal:  No neck pain,She has left > right hip pain.  Lower back  pain Integumentary: No rash, pruritus, skin lesions Neurological: as above Psychiatric: No depression at this time.  No anxiety Endocrine: No palpitations, diaphoresis, change in appetite, change in weigh or increased thirst Hematologic/Lymphatic:  No anemia, purpura, petechiae. Allergic/Immunologic: No itchy/runny eyes, nasal congestion, recent allergic reactions, rashes  ALLERGIES: No Known Allergies  HOME MEDICATIONS: Outpatient Medications Prior to Visit  Medication Sig Dispense Refill   acetaminophen (TYLENOL) 500 MG tablet Take 2 tablets (1,000 mg total) by mouth every 8 (eight) hours as needed for moderate pain. 90 tablet 5   Alemtuzumab 12 MG/1.2ML SOLN Inject 12 mg into the vein.     Alpha Lipoic Acid 200 MG CAPS Take 2 tablets daily 60 capsule 11   amantadine (SYMMETREL) 100 MG capsule TAKE 1 CAPSULE(100 MG) BY MOUTH TWICE DAILY 180 capsule 0   baclofen (LIORESAL) 10 MG tablet Take 1 tablet (10 mg total) by mouth at bedtime. 30 each 11   Cholecalciferol (VITAMIN D3) 5000 UNITS CAPS Take 1 capsule (5,000 Units total) by mouth daily. 90 capsule 3   Cladribine, 5 Tabs, (MAVENCLAD, 5 TABS,)  10 MG TBPK Take by mouth. Started 09/17/22-M1Y1. Take 10mg  daily on days 1-5 on month one. Take 10mg  daily on days 1-5 one month later.     co-enzyme Q-10 50 MG capsule Take 50 mg by mouth daily.     diazepam (VALIUM) 5 MG tablet TAKE 1 TABLET(5 MG) BY MOUTH AT BEDTIME AS NEEDED 30 tablet 0   EVENING PRIMROSE OIL PO Take 1 Dose by mouth daily.      imipramine (TOFRANIL) 25 MG tablet TAKE 1 TO 2 TABLETS(25 TO 50 MG) BY MOUTH AT BEDTIME 180 tablet 2   oxybutynin (DITROPAN XL) 15 MG 24 hr tablet Take 1 tablet (15 mg total) by mouth at bedtime. 90 tablet 0   oxyCODONE-acetaminophen (PERCOCET/ROXICET) 5-325 MG tablet Take 1 tablet by mouth every 12 (twelve) hours as needed for severe pain. 30 tablet 0   pantoprazole (PROTONIX) 40 MG tablet Take 40 mg by mouth daily.     propranolol (INDERAL) 20 MG tablet Take 20 mg by mouth 3 (three) times daily.     rOPINIRole (REQUIP) 1 MG tablet TAKE 2 TABLETS (2 MG TOTAL) BY MOUTH AT BEDTIME. 180 tablet 2   tiZANidine (ZANAFLEX) 4 MG tablet TAKE 1 TABLET BY MOUTH 3 TIMES DAILY. 270 tablet 2   UNABLE TO FIND Take 1 capsule by mouth daily. Essential Amino Acids     Omeprazole (PRILOSEC PO) Take by mouth.     No facility-administered medications prior to visit.  also on Tecfidera 240 mg bid, imipramine 25 mg at night and Ritalin 20 mg once or twice a day  PAST MEDICAL HISTORY: Past Medical History:  Diagnosis Date   Movement disorder    Multiple sclerosis (HCC)    Neuropathy    Pneumothorax, spontaneous, tension    Left x 2   Vision abnormalities     PAST SURGICAL HISTORY: Past Surgical History:  Procedure Laterality Date   PLEURAL SCARIFICATION     collasped lung     FAMILY HISTORY: Family History  Problem Relation Age of Onset   Hypertension Mother    Hyperlipidemia Mother    Colon cancer Mother    COPD Father    Multiple sclerosis Sister     SOCIAL HISTORY:  Social History   Socioeconomic History   Marital status: Married    Spouse  name: Not on file   Number of children: Not on  file   Years of education: Not on file   Highest education level: Not on file  Occupational History   Not on file  Tobacco Use   Smoking status: Former    Types: Cigarettes   Smokeless tobacco: Never  Vaping Use   Vaping status: Never Used  Substance and Sexual Activity   Alcohol use: No    Alcohol/week: 0.0 standard drinks of alcohol   Drug use: No   Sexual activity: Not on file  Other Topics Concern   Not on file  Social History Narrative   Not on file   Social Determinants of Health   Financial Resource Strain: Not on file  Food Insecurity: Not on file  Transportation Needs: Not on file  Physical Activity: Not on file  Stress: Not on file  Social Connections: Not on file  Intimate Partner Violence: Not on file     PHYSICAL EXAM  Vitals:   08/14/23 1428  BP: 102/71  Pulse: 89  Weight: 142 lb 6.4 oz (64.6 kg)  Height: 5' 5.5" (1.664 m)   Pulse rechecked and was 116.  Body mass index is 23.34 kg/m.   General: The patient is well-developed and well-nourished and in no acute distress   Neurologic Exam  Mental status: The patient is alert and oriented x 3 at the time of the examination. The patient has apparent normal recent and remote memory, with an apparently normal attention span and concentration ability.   Speech is normal.  Cranial nerves: Extraocular movements are full.  Facial strength s normal.. No dysarthria is noted.   No obvious hearing deficits are noted.  Motor: Muscle tone is increased in the legs, right greater than left..   There is minimal right arm increased tone though strength was good in the arms.  Strength was 2-/5 on right and 3 to 4-/5 on the left.  Reduced rapid alternating movements  Sensory: Sensory testing is intact to touch and vibration sensation in all 4 extremities.  Coordination: Cerebellar testing reveals good finger-nose-finger on the left but mildly reduced rapid alternating  movements and FTN in right hand.   Heel-to-shin was reduced on the left and she cannot do the right  Gait and station: can not stand without strong bilat support.     Reflexes: Deep tendon reflexes are increased, right > left.  She has increased reflexes at the knees.  She does not have ankle clonus.   DIAGNOSTIC DATA (LABS, IMAGING, TESTING) - I reviewed patient records, labs, notes, testing and imaging myself where available.  Lab Results  Component Value Date   WBC 6.2 03/12/2023   HGB 13.5 03/12/2023   HCT 42.0 03/12/2023   MCV 87 03/12/2023   PLT 258 03/12/2023       ASSESSMENT AND PLAN   Multiple sclerosis (HCC) - Plan: QuantiFERON-TB Gold Plus, HIV Antibody (routine testing w rflx), Hepatitis B surface antigen, Hepatitis B surface antibody,qualitative, CBC with Differential/Platelet, Comprehensive metabolic panel  High risk medication use - Plan: QuantiFERON-TB Gold Plus, HIV Antibody (routine testing w rflx), Hepatitis B surface antigen, Hepatitis B surface antibody,qualitative, CBC with Differential/Platelet, Comprehensive metabolic panel  Urinary dysfunction  Abnormal gait    1.  Labs for [facility] in advance of 2nd year to determine if there are any chronic infections.    Will do second year around October 2024 2.   Her low back pain is stable.  She has degenerative changes at L4-L5 and L5-S1.  She has seen the orthopedic spine center  but watchful waiting was advised due to her weakness and probability of slow recovery..  If pain intensifies again we will consider epidural steroid on the left L4-L5 or medial branch block/RFA  3.  Continue tizanidine for spasticity.  Valium to help with spasticity and insomnia prn.   4.  Return in 6 months or sooner if there are new or worsening neurologic symptoms.   40 minute office visit with the majority of the time spent face-to-face for history and physical, discussion/counseling and decision-making.  Additional time with  record review and documentation.  This visit is part of a comprehensive longitudinal care medical relationship regarding the patients primary diagnosis of MS and related concerns.   Evellyn Tuff A. Epimenio Foot, MD, PhD 08/14/2023, 3:08 PM Certified in Neurology, Clinical Neurophysiology, Sleep Medicine, Pain Medicine and Neuroimaging  [facility] [address] [place], [place] [address] [phone]

## 2023-08-15 ENCOUNTER — Encounter: Payer: Self-pay | Admitting: Neurology

## 2023-08-15 DIAGNOSIS — Z0289 Encounter for other administrative examinations: Secondary | ICD-10-CM

## 2023-08-19 LAB — CBC WITH DIFFERENTIAL/PLATELET
Basophils Absolute: 0 10*3/uL (ref 0.0–0.2)
Basos: 1 %
EOS (ABSOLUTE): 0.1 10*3/uL (ref 0.0–0.4)
Eos: 2 %
Hematocrit: 44.3 % (ref 34.0–46.6)
Hemoglobin: 13.7 g/dL (ref 11.1–15.9)
Immature Grans (Abs): 0 10*3/uL (ref 0.0–0.1)
Immature Granulocytes: 0 %
Lymphocytes Absolute: 1.4 10*3/uL (ref 0.7–3.1)
Lymphs: 20 %
MCH: 26.8 pg (ref 26.6–33.0)
MCHC: 30.9 g/dL — ABNORMAL LOW (ref 31.5–35.7)
MCV: 87 fL (ref 79–97)
Monocytes Absolute: 0.5 10*3/uL (ref 0.1–0.9)
Monocytes: 7 %
Neutrophils Absolute: 5.1 10*3/uL (ref 1.4–7.0)
Neutrophils: 70 %
Platelets: 298 10*3/uL (ref 150–450)
RBC: 5.12 x10E6/uL (ref 3.77–5.28)
RDW: 13.6 % (ref 11.7–15.4)
WBC: 7.2 10*3/uL (ref 3.4–10.8)

## 2023-08-19 LAB — COMPREHENSIVE METABOLIC PANEL
ALT: 14 IU/L (ref 0–32)
AST: 10 IU/L (ref 0–40)
Albumin: 4.6 g/dL (ref 3.8–4.9)
Alkaline Phosphatase: 61 IU/L (ref 44–121)
BUN/Creatinine Ratio: 25 — ABNORMAL HIGH (ref 9–23)
BUN: 17 mg/dL (ref 6–24)
Bilirubin Total: 0.2 mg/dL (ref 0.0–1.2)
CO2: 25 mmol/L (ref 20–29)
Calcium: 9.8 mg/dL (ref 8.7–10.2)
Chloride: 102 mmol/L (ref 96–106)
Creatinine, Ser: 0.69 mg/dL (ref 0.57–1.00)
Globulin, Total: 2.5 g/dL (ref 1.5–4.5)
Glucose: 97 mg/dL (ref 70–99)
Potassium: 4.4 mmol/L (ref 3.5–5.2)
Sodium: 140 mmol/L (ref 134–144)
Total Protein: 7.1 g/dL (ref 6.0–8.5)
eGFR: 105 mL/min/{1.73_m2} (ref >59)

## 2023-08-19 LAB — QUANTIFERON-TB GOLD PLUS
QuantiFERON Mitogen Value: 0.82 [IU]/mL
QuantiFERON Nil Value: 0 [IU]/mL
QuantiFERON TB1 Ag Value: 0 [IU]/mL
QuantiFERON TB2 Ag Value: 0 [IU]/mL
QuantiFERON-TB Gold Plus: NEGATIVE

## 2023-08-19 LAB — HEPATITIS B SURFACE ANTIGEN: Hepatitis B Surface Ag: NEGATIVE

## 2023-08-19 LAB — HIV ANTIBODY (ROUTINE TESTING W REFLEX): HIV Screen 4th Generation wRfx: NONREACTIVE

## 2023-08-19 LAB — HEPATITIS B SURFACE ANTIBODY,QUALITATIVE: Hep B Surface Ab, Qual: NONREACTIVE

## 2023-08-20 ENCOUNTER — Telehealth: Payer: Self-pay | Admitting: *Deleted

## 2023-08-20 ENCOUNTER — Encounter: Payer: Self-pay | Admitting: Neurology

## 2023-08-20 ENCOUNTER — Other Ambulatory Visit: Payer: Self-pay

## 2023-08-20 MED ORDER — TIZANIDINE HCL 4 MG PO TABS: 4.0000 mg | ORAL_TABLET | Freq: Three times a day (TID) | ORAL | 2 refills | Status: DC

## 2023-08-20 NOTE — Telephone Encounter (Signed)
-----   Message from Huston Foley sent at 08/15/2023  4:02 PM EDT ----- So far, labs okay but TB test pending, will have to wait for results to proceed with treatment as planned.

## 2023-08-20 NOTE — Telephone Encounter (Signed)
I called and relayed by VM (ok per DPR) that her labs ok, Tb negative.  Will send off Mavenclad form.  She is to call back if questions.

## 2023-08-21 NOTE — Telephone Encounter (Signed)
Spoke with Dr. Frances Furbish, and ok'd to send form. Fax confirmation received [facility] 5 pgs [phone], ov [phone].

## 2023-08-23 NOTE — Telephone Encounter (Signed)
Initiated PA for [facility] via CMM. Sent to [facility]. Key: [id number]. Should have a determination within 1-3 business days.

## 2023-08-29 ENCOUNTER — Telehealth: Payer: Self-pay

## 2023-08-29 NOTE — Telephone Encounter (Signed)
Called pt and let her know her paperwork was filled out and signed. And is being Faxed off to [employer] [phone] on 08/29/2023.

## 2023-09-04 ENCOUNTER — Other Ambulatory Visit: Payer: Self-pay | Admitting: Neurology

## 2023-09-04 NOTE — Telephone Encounter (Signed)
Last seen on 08/14/23 Follow up scheduled on 12/16/23

## 2023-09-11 NOTE — Addendum Note (Signed)
Addended by: Geronimo Running A on: 09/11/2023 07:34 AM   Modules accepted: Orders

## 2023-09-30 ENCOUNTER — Encounter: Payer: Self-pay | Admitting: Neurology

## 2023-09-30 ENCOUNTER — Other Ambulatory Visit: Payer: Self-pay | Admitting: *Deleted

## 2023-09-30 MED ORDER — LIDOCAINE 5 % EX OINT: 1.0000 | TOPICAL_OINTMENT | CUTANEOUS | 0 refills | Status: DC | PRN

## 2023-12-16 ENCOUNTER — Ambulatory Visit: Payer: Medicare HMO | Admitting: Neurology

## 2023-12-16 ENCOUNTER — Encounter: Payer: Self-pay | Admitting: Neurology

## 2023-12-16 VITALS — BP 107/71 | HR 89 | Ht 65.5 in

## 2023-12-16 DIAGNOSIS — R5383 Other fatigue: Secondary | ICD-10-CM

## 2023-12-16 DIAGNOSIS — M47816 Spondylosis without myelopathy or radiculopathy, lumbar region: Secondary | ICD-10-CM

## 2023-12-16 DIAGNOSIS — Z79899 Other long term (current) drug therapy: Secondary | ICD-10-CM | POA: Diagnosis not present

## 2023-12-16 DIAGNOSIS — R635 Abnormal weight gain: Secondary | ICD-10-CM

## 2023-12-16 DIAGNOSIS — G35 Multiple sclerosis: Secondary | ICD-10-CM | POA: Diagnosis not present

## 2023-12-16 DIAGNOSIS — R269 Unspecified abnormalities of gait and mobility: Secondary | ICD-10-CM

## 2023-12-16 DIAGNOSIS — R39198 Other difficulties with micturition: Secondary | ICD-10-CM | POA: Diagnosis not present

## 2023-12-16 DIAGNOSIS — G35D Multiple sclerosis, unspecified: Secondary | ICD-10-CM

## 2023-12-16 DIAGNOSIS — M7138 Other bursal cyst, other site: Secondary | ICD-10-CM

## 2023-12-16 MED ORDER — PHENTERMINE HCL 37.5 MG PO CAPS: 37.5000 mg | ORAL_CAPSULE | ORAL | 5 refills | Status: AC

## 2023-12-16 NOTE — Progress Notes (Signed)
 [facility]  PATIENT: Katherine Cameron DOB: September 29, 1971  REFERRING CLINICIAN: Hedgecock, PA-C HISTORY FROM: Patient with input form husband   REASON FOR VISIT: MS and spastic gait   HISTORICAL  CHIEF COMPLAINT:  Chief Complaint  Patient presents with   Follow-up    RM 2. Last seen 08/14/23. MS DMT: Mavenclad In electric WC today. Feels overall, she has worsened. Finished year 2 Mavenclad 10/08/23.     HISTORY OF PRESENT ILLNESS:  Katherine Cameron is a 53 year old woman with active secondary progressive MS    Update 12/16/2023  She did Mavenclad October and November 2023 and she tolerated it very well.   The second year was Oct.Nov 2024 She has had no exacerrbations but has noted mild progression.     Mavenclad, she had the two years of Lemtrada in 2018 and April 2019. Labs 11/20/2022 showed lymphocyte count was 0.5.   03/12/2023 labs showed count was 1.1.     She feels stable .  No new neurologic symptoms.   She is able to go 10-20 feet with a Rollator if well rested but then is wiped out.   If tired does worse.  She is always worse lte afternoons and evenings..    She has a folding power wheelchair and likes it better than the scooter.  She has had a couple controlled falls.     She has weakness in both legs, right greater than left, she also notes reduced fine movements in the right hand.  She has had some difficulty with handwriting.  She  has urinary urgency and incontinence.   She has trouble tolerating briefs that have any plastic.     Oxybutynin  helps the urgency but dries her mouth.  Myrbetriq  and Gemtesa were not covered well so she never took.   Gemtesa helped the most..   Vesicare and flomax  had not helped in the past.    Botox was considered.     She notes vision is reduced and has ophtho appt next week.    She is sleeping 3-4 good hours with ropinirole  and imipramine .  She will sometimes take diazepam   Her MS has been stable but she notes more LBP.   She notes  more spasticity in her right leg, especially if cold or at night.    She takes tizanidine  and MJ every night and occasionally valium  if having trouble sleeping due to the spasticity.     She has had a mild tremor in her head > hands over last 6 months.   She takes propranolol and is not sure it helps the tremor  Amantadine  100 mg twice daily has helped fatigue.    She notes some cognitive issues.   Specifically, verbal processing and fluency are reduced.   She is forgetful.    She has reduced focus/attention.  Provigil did not help much.   Ritalin  helped some but caused tachycardia so she discontinued.      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  She saw a spine surgeon (Dr. Marlyce) and was advised to wait/monitor.   She leans forward while walking.   She sleeps in a fetal position.  Pain is actually down a bit better since the last visit.SABRA   MRI in 2023 was stable compared to 2021.  MS History:  She presented with right optic neuritis. She went to an ophthalmologist and then was referred to neurology where she had an MRI of the brain performed. The MRI was consistent with MS. She did not need to have a lumbar puncture. This was before FDA approved medications and she had several courses of IV steroids. Around 1994, she started Avonex after a large exacerbation around 2008, she went on Tysabri. She was more stable on Tysabri. Unfortunately, she had a a high titer JCV antibody. She was on Tysabri for about 3 years and then was switched to Tecfidera . She has been on Tecfidera  for about 3 years. During the transition from Tysabri to Tecfidera , she had an especially severe exacerbation with significant worsening of her gait. A new focus was noted in the cervical spine.   She switched to Lemtrada in 2018 and had her second year April 2019.   She is compliant with the rems program   Imaging: MRI of the brain 07/17/2022 shows Multiple T2/FLAIR hyperintense foci in the brainstem, middle cerebellar peduncles, thalamus and cerebral hemispheres in a pattern consistent with chronic demyelinating plaque associated with multiple sclerosis.  None of the foci appear to be acute.  No enhancing lesions.  Compared to the MRI from 05/24/2020, there are no new lesions.  Mild generalized cortical atrophy.  MRI of the lumbar spine 07/17/2022 shows At L4-L5, there is severe facet hypertrophy and a left synovial cyst causing left lateral recess stenosis that could lead to left L5 nerve root compression.  There is mild foraminal narrowing but no impingement upon the L4 nerve roots.  The synovial cyst is the same or slightly smaller compared to the 2021 MRI.    At L5-S1, there is anterolisthesis due to facet hypertrophy and pars defect.  No nerve root compression at this level.  MRI of the brain 05/24/2020 shows multiple T2/FLAIR hyperintense foci in the hemispheres, cerebellum, thalamus and brainstem in a pattern and configuration consistent with chronic demyelinating plaque associated with multiple sclerosis.  None of the foci enhance or appear to be acute.  Compared to the MRI dated 04/17/2019, there are no new lesions.   There is a normal enhancement pattern.  No acute findings.  MRI of the thoracic spine 05/24/2020 shows T2 hyperintense signal within the spinal cord consistent with chronic demyelinating plaque associated with multiple sclerosis.   No significant disc degenerative changes and spondylosis is noted.  There is no nerve root compression or spinal stenosis.  MRI of the cervical spine 05/24/2020 shows T2 hyperintense signal changes noted throughout the cervical spinal cord.  This appears unchanged compared to the 04/17/2019 MRI.  Additional foci are noted in the cerebellum and brainstem.SABRA   Spinal cord atrophy is noted, also unchanged.   Multilevel  degenerative changes as detailed above that do not lead to nerve root compression or spinal stenosis.  MRI of the lumbar spine 08/15/2019 shows severe facet arthrosis at L4-5 and L5-S1 with resultant grade 1 anterolisthesis at the lower level. Mass effect on the descending left L5 nerve root secondary to synovial cyst arising from the facet joint at L4-5, unchanged.  REVIEW OF SYSTEMS:  Constitutional: No fevers, chills, sweats, or change in appetite.  She has insomnia Eyes: No visual changes, double vision, eye pain Ear, nose and throat: No hearing loss, ear pain, nasal congestion, sore throat Cardiovascular: No chest pain, palpitations Respiratory:  No shortness of breath at rest or with exertion.   No wheezes GastrointestinaI: No nausea, vomiting,  diarrhea, abdominal pain, fecal incontinence Genitourinary:  No dysuria, urinary retention or frequency.  No nocturia. Musculoskeletal:  No neck pain,She has left > right hip pain.  Lower back pain Integumentary: No rash, pruritus, skin lesions Neurological: as above Psychiatric: No depression at this time.  No anxiety Endocrine: No palpitations, diaphoresis, change in appetite, change in weigh or increased thirst Hematologic/Lymphatic:  No anemia, purpura, petechiae. Allergic/Immunologic: No itchy/runny eyes, nasal congestion, recent allergic reactions, rashes  ALLERGIES: No Known Allergies  HOME MEDICATIONS: Outpatient Medications Prior to Visit  Medication Sig Dispense Refill   acetaminophen  (TYLENOL ) 500 MG tablet Take 2 tablets (1,000 mg total) by mouth every 8 (eight) hours as needed for moderate pain. 90 tablet 5   Alpha Lipoic Acid  200 MG CAPS Take 2 tablets daily 60 capsule 11   amantadine  (SYMMETREL ) 100 MG capsule TAKE 1 CAPSULE(100 MG) BY MOUTH TWICE DAILY 180 capsule 0   baclofen  (LIORESAL ) 10 MG tablet Take 1 tablet (10 mg total) by mouth at bedtime. 30 each 11   Cholecalciferol (VITAMIN D3) 5000 UNITS CAPS Take 1 capsule  (5,000 Units total) by mouth daily. 90 capsule 3   Cladribine, 6 Tabs, (MAVENCLAD, 6 TABS,) 10 MG TBPK Take 10 mg by mouth as directed.     co-enzyme Q-10 50 MG capsule Take 50 mg by mouth daily.     diazepam  (VALIUM ) 5 MG tablet TAKE 1 TABLET(5 MG) BY MOUTH AT BEDTIME AS NEEDED 30 tablet 0   EVENING PRIMROSE OIL PO Take 1 Dose by mouth daily.      imipramine  (TOFRANIL ) 25 MG tablet TAKE 1 TO 2 TABLETS(25 TO 50 MG) BY MOUTH AT BEDTIME 180 tablet 2   lidocaine  (XYLOCAINE ) 5 % ointment Apply 1 Application topically as needed. 35.44 g 0   oxybutynin  (DITROPAN  XL) 15 MG 24 hr tablet Take 1 tablet (15 mg total) by mouth at bedtime. 90 tablet 0   oxyCODONE -acetaminophen  (PERCOCET/ROXICET) 5-325 MG tablet Take 1 tablet by mouth every 12 (twelve) hours as needed for severe pain. 30 tablet 0   pantoprazole (PROTONIX) 40 MG tablet Take 40 mg by mouth daily.     propranolol (INDERAL) 20 MG tablet Take 20 mg by mouth 3 (three) times daily.     rOPINIRole  (REQUIP ) 1 MG tablet TAKE 2 TABLETS (2 MG TOTAL) BY MOUTH AT BEDTIME. 180 tablet 2   tiZANidine  (ZANAFLEX ) 4 MG tablet Take 1 tablet (4 mg total) by mouth 3 (three) times daily. 270 tablet 2   UNABLE TO FIND Take 1 capsule by mouth daily. Essential Amino Acids     Alemtuzumab 12 MG/1.2ML SOLN Inject 12 mg into the vein.     No facility-administered medications prior to visit.  also on Tecfidera  240 mg bid, imipramine  25 mg at night and Ritalin  20 mg once or twice a day  PAST MEDICAL HISTORY: Past Medical History:  Diagnosis Date   Movement disorder    Multiple sclerosis (HCC)    Neuropathy    Pneumothorax, spontaneous, tension    Left x 2   Vision abnormalities     PAST SURGICAL HISTORY: Past Surgical History:  Procedure Laterality Date   PLEURAL SCARIFICATION     collasped lung     FAMILY HISTORY: Family History  Problem Relation Age of Onset   Hypertension Mother    Hyperlipidemia Mother    Colon cancer Mother    COPD Father     Multiple sclerosis Sister     SOCIAL HISTORY:  Social History  Socioeconomic History   Marital status: Married    Spouse name: Not on file   Number of children: Not on file   Years of education: Not on file   Highest education level: Not on file  Occupational History   Not on file  Tobacco Use   Smoking status: Former    Types: Cigarettes   Smokeless tobacco: Never  Vaping Use   Vaping status: Never Used  Substance and Sexual Activity   Alcohol use: No    Alcohol/week: 0.0 standard drinks of alcohol   Drug use: No   Sexual activity: Not on file  Other Topics Concern   Not on file  Social History Narrative   Not on file   Social Drivers of Health   Financial Resource Strain: Not on file  Food Insecurity: Not on file  Transportation Needs: Not on file  Physical Activity: Not on file  Stress: Not on file  Social Connections: Not on file  Intimate Partner Violence: Not on file     PHYSICAL EXAM  Vitals:   12/16/23 1341  BP: 107/71  Pulse: 89  Height: 5' 5.5 (1.664 m)   Pulse rechecked and was 116.  Body mass index is 23.34 kg/m.   General: The patient is well-developed and well-nourished and in no acute distress   Neurologic Exam  Mental status: The patient is alert and oriented x 3 at the time of the examination. The patient has apparent normal recent and remote memory, with an apparently normal attention span and concentration ability.   Speech is normal.  Cranial nerves: Extraocular movements are full.  Facial strength s normal.. No dysarthria is noted.   No obvious hearing deficits are noted.  Motor: Muscle tone is increased in the legs, right greater than left..   There is mild right arm weakness (3 to 4- in deltoid and 4 to 4+ elsewhere).     Strength was 2-/5 on right and 3 to 4-/5 on the left.  Rapid alternating movements are reduced in the right hand relative to the left  Sensory: Sensory testing is intact to touch and vibration sensation in  all 4 extremities.  Coordination: Cerebellar testing reveals good finger-nose-finger on the left but mildly reduced rapid alternating movements and FNF in right hand.   Heel-to-shin was reduced on the left and she cannot do the right  Gait and station: can not stand without strong bilat support.     Reflexes: Deep tendon reflexes are increased, right > left.  She has increased reflexes at the knees.  She does not have ankle clonus.   DIAGNOSTIC DATA (LABS, IMAGING, TESTING) - I reviewed patient records, labs, notes, testing and imaging myself where available.  Lab Results  Component Value Date   WBC 7.2 08/14/2023   HGB 13.7 08/14/2023   HCT 44.3 08/14/2023   MCV 87 08/14/2023   PLT 298 08/14/2023       ASSESSMENT AND PLAN   Multiple sclerosis (HCC) - Plan: CBC with Differential/Platelet, Comprehensive metabolic panel  High risk medication use - Plan: CBC with Differential/Platelet, Comprehensive metabolic panel  Facet syndrome, lumbar  Urinary dysfunction  Abnormal gait  Other fatigue  Synovial cyst of lumbar spine    1.  She has completed the second year Mavenclad.  We will check the CBC with differential and CMP.  If the lymphocyte count is 0.2 or lower then I would recommend that she take Valtrex  for several months.   2.   Her low back  pain is stable.  She has degenerative changes at L4-L5 and L5-S1.  She has seen the orthopedic spine center but watchful waiting was advised due to her weakness and probability of slow recovery..  If pain intensifies again we will consider epidural steroid on the left L4-L5 or medial branch block/RFA  3.  Continue tizanidine  for spasticity.  Valium  to help with spasticity and insomnia prn.   4.  Phentermine  for weight loss and MS fatigue.  If she feels it helps better than the amantadine , she can stop that medication.  Try to be more active during the daytime. Return in 6 months or sooner if there are new or worsening neurologic  symptoms.  This visit is part of a comprehensive longitudinal care medical relationship regarding the patients primary diagnosis of MS and related concerns.   Jaquay Morneault A. Vear, MD, PhD 12/16/2023, 5:04 PM Certified in Neurology, Clinical Neurophysiology, Sleep Medicine, Pain Medicine and Neuroimaging  [facility] [address] [place], [place] [address] [phone]

## 2023-12-17 LAB — CBC WITH DIFFERENTIAL/PLATELET
Basophils Absolute: 0.1 10*3/uL (ref 0.0–0.2)
Basos: 1 %
EOS (ABSOLUTE): 0.3 10*3/uL (ref 0.0–0.4)
Eos: 5 %
Hematocrit: 38.1 % (ref 34.0–46.6)
Hemoglobin: 12.1 g/dL (ref 11.1–15.9)
Immature Grans (Abs): 0 10*3/uL (ref 0.0–0.1)
Immature Granulocytes: 0 %
Lymphocytes Absolute: 0.9 10*3/uL (ref 0.7–3.1)
Lymphs: 14 %
MCH: 26.5 pg — ABNORMAL LOW (ref 26.6–33.0)
MCHC: 31.8 g/dL (ref 31.5–35.7)
MCV: 83 fL (ref 79–97)
Monocytes Absolute: 0.5 10*3/uL (ref 0.1–0.9)
Monocytes: 8 %
Neutrophils Absolute: 4.3 10*3/uL (ref 1.4–7.0)
Neutrophils: 72 %
Platelets: 271 10*3/uL (ref 150–450)
RBC: 4.57 x10E6/uL (ref 3.77–5.28)
RDW: 13.8 % (ref 11.7–15.4)
WBC: 6.1 10*3/uL (ref 3.4–10.8)

## 2023-12-17 LAB — COMPREHENSIVE METABOLIC PANEL
ALT: 19 [IU]/L (ref 0–32)
AST: 16 [IU]/L (ref 0–40)
Albumin: 4.4 g/dL (ref 3.8–4.9)
Alkaline Phosphatase: 69 [IU]/L (ref 44–121)
BUN/Creatinine Ratio: 30 — ABNORMAL HIGH (ref 9–23)
BUN: 20 mg/dL (ref 6–24)
Bilirubin Total: <0.2 mg/dL (ref 0.0–1.2)
CO2: 23 mmol/L (ref 20–29)
Calcium: 9.4 mg/dL (ref 8.7–10.2)
Chloride: 101 mmol/L (ref 96–106)
Creatinine, Ser: 0.66 mg/dL (ref 0.57–1.00)
Globulin, Total: 2.4 g/dL (ref 1.5–4.5)
Glucose: 96 mg/dL (ref 70–99)
Potassium: 4.6 mmol/L (ref 3.5–5.2)
Sodium: 139 mmol/L (ref 134–144)
Total Protein: 6.8 g/dL (ref 6.0–8.5)
eGFR: 105 mL/min/{1.73_m2} (ref >59)

## 2024-01-06 ENCOUNTER — Other Ambulatory Visit: Payer: Self-pay | Admitting: Neurology

## 2024-01-08 ENCOUNTER — Other Ambulatory Visit: Payer: Self-pay

## 2024-01-09 ENCOUNTER — Other Ambulatory Visit: Payer: Self-pay

## 2024-01-23 ENCOUNTER — Other Ambulatory Visit (HOSPITAL_COMMUNITY): Payer: Self-pay

## 2024-02-12 ENCOUNTER — Encounter: Payer: Self-pay | Admitting: Neurology

## 2024-02-13 MED ORDER — OXYBUTYNIN CHLORIDE ER 15 MG PO TB24: 15.0000 mg | ORAL_TABLET | Freq: Every day | ORAL | 0 refills | Status: DC

## 2024-02-19 ENCOUNTER — Other Ambulatory Visit: Payer: Self-pay | Admitting: Neurology

## 2024-02-26 ENCOUNTER — Encounter: Payer: Self-pay | Admitting: Neurology

## 2024-02-26 DIAGNOSIS — R5383 Other fatigue: Secondary | ICD-10-CM

## 2024-02-26 DIAGNOSIS — G35 Multiple sclerosis: Secondary | ICD-10-CM

## 2024-02-26 DIAGNOSIS — R269 Unspecified abnormalities of gait and mobility: Secondary | ICD-10-CM

## 2024-02-26 DIAGNOSIS — M5416 Radiculopathy, lumbar region: Secondary | ICD-10-CM

## 2024-02-27 ENCOUNTER — Telehealth: Payer: Self-pay | Admitting: Neurology

## 2024-02-27 NOTE — Telephone Encounter (Signed)
Referral for physical therapy fax to [facility]. Phone: [phone], Fax: [phone].

## 2024-04-09 ENCOUNTER — Other Ambulatory Visit: Payer: Self-pay | Admitting: Neurology

## 2024-04-09 ENCOUNTER — Encounter: Payer: Self-pay | Admitting: Neurology

## 2024-04-09 MED ORDER — ESCITALOPRAM OXALATE 20 MG PO TABS: 20.0000 mg | ORAL_TABLET | Freq: Every day | ORAL | 3 refills | Status: AC

## 2024-04-14 ENCOUNTER — Other Ambulatory Visit: Payer: Self-pay | Admitting: Neurology

## 2024-04-14 ENCOUNTER — Encounter: Payer: Self-pay | Admitting: Neurology

## 2024-04-14 NOTE — Telephone Encounter (Signed)
 Sent pending order to Dr Godwin Lat for approval for lidocaine  cream

## 2024-05-03 ENCOUNTER — Other Ambulatory Visit: Payer: Self-pay | Admitting: Neurology

## 2024-05-03 ENCOUNTER — Encounter: Payer: Self-pay | Admitting: Neurology

## 2024-05-05 ENCOUNTER — Other Ambulatory Visit: Payer: Self-pay | Admitting: Neurology

## 2024-05-05 ENCOUNTER — Other Ambulatory Visit (HOSPITAL_COMMUNITY): Payer: Self-pay

## 2024-05-05 ENCOUNTER — Other Ambulatory Visit: Payer: Self-pay

## 2024-05-05 ENCOUNTER — Telehealth: Payer: Self-pay

## 2024-05-05 MED ORDER — PREGABALIN 75 MG PO CAPS: ORAL_CAPSULE | ORAL | 5 refills | Status: DC

## 2024-05-05 NOTE — Telephone Encounter (Signed)
 Pharmacy Patient Advocate Encounter  Received notification from [facility] that Prior Authorization for Pregabalin  75MG  capsules has been DENIED.  Full denial letter will be uploaded to the media tab. See denial reason below.   PA #/Case ID/Reference #: PA Case ID #: [id number]

## 2024-05-05 NOTE — Telephone Encounter (Signed)
 Pharmacy Patient Advocate Encounter   Received notification from CoverMyMeds that prior authorization for Pregabalin  75MG  capsules is required/requested.   Insurance verification completed.   The patient is insured through [facility] .   Per test claim: PA required; PA started via CoverMyMeds. KEY [id number] . Waiting for clinical questions to populate.

## 2024-05-05 NOTE — Telephone Encounter (Signed)
 Last seen on 12/16/23 Follow up scheduled on 06/25/24

## 2024-05-05 NOTE — Telephone Encounter (Signed)
 Sent pt mychart message

## 2024-05-21 NOTE — Telephone Encounter (Signed)
 Received form below from [employer]. Completed and faxed back, pending decision.

## 2024-06-01 ENCOUNTER — Telehealth: Payer: Self-pay | Admitting: Neurology

## 2024-06-01 NOTE — Telephone Encounter (Signed)
 Patient reschedule appointment due to scheduling conflict.

## 2024-06-03 ENCOUNTER — Ambulatory Visit: Admitting: Neurology

## 2024-06-25 ENCOUNTER — Ambulatory Visit: Payer: Medicare HMO | Admitting: Neurology

## 2024-07-07 ENCOUNTER — Telehealth: Payer: Self-pay | Admitting: Neurology

## 2024-07-07 NOTE — Telephone Encounter (Signed)
 Pt husband came in to drop off forms and pay form fee. I gave him medical release form to be signed by pt. They will mail that back; in meantime, forms will be in [place] office waiting for medical release form. They'd like completed forms to be faxed then mail original packet back to pt for their records.

## 2024-07-09 NOTE — Telephone Encounter (Signed)
Form completed, pending MD signature.

## 2024-07-09 NOTE — Telephone Encounter (Signed)
Gave completed/signed form back to MR to process for pt. °

## 2024-07-15 ENCOUNTER — Other Ambulatory Visit: Payer: Self-pay | Admitting: Neurology

## 2024-07-15 NOTE — Telephone Encounter (Signed)
 Last seen on 12/16/23 Follow up scheduled 12/28/24  Last filled on 02/24/23  Rx pending to be signed

## 2024-07-28 ENCOUNTER — Encounter: Payer: Self-pay | Admitting: Neurology

## 2024-08-04 ENCOUNTER — Other Ambulatory Visit: Payer: Self-pay | Admitting: Neurology

## 2024-08-04 MED ORDER — ROPINIROLE HCL 1 MG PO TABS: ORAL_TABLET | ORAL | 3 refills | Status: AC

## 2024-08-08 ENCOUNTER — Other Ambulatory Visit: Payer: Self-pay | Admitting: Neurology

## 2024-08-10 NOTE — Telephone Encounter (Signed)
 Last seen on 12/16/23 Follow up scheduled on 12/28/24

## 2024-09-24 ENCOUNTER — Other Ambulatory Visit: Payer: Self-pay | Admitting: Neurology

## 2024-09-24 MED ORDER — BACLOFEN 10 MG PO TABS: 10.0000 mg | ORAL_TABLET | Freq: Every day | ORAL | 2 refills | Status: DC

## 2024-09-24 NOTE — Telephone Encounter (Signed)
 Pt(spouse made call) is requesting a refill for baclofen  (LIORESAL ) 10 MG tablet.  [phone]

## 2024-09-24 NOTE — Telephone Encounter (Signed)
 Last seen on 12/16/23 Follow up scheduled on 12/28/24   Did you want patient to continue Rx, I didn't see it mentioned in note.   Rx is pending

## 2024-11-21 ENCOUNTER — Encounter: Payer: Self-pay | Admitting: Neurology

## 2024-11-30 ENCOUNTER — Other Ambulatory Visit: Payer: Self-pay

## 2024-11-30 MED ORDER — PREGABALIN 75 MG PO CAPS: ORAL_CAPSULE | ORAL | 5 refills | Status: AC

## 2024-11-30 NOTE — Progress Notes (Signed)
 Pt last Seen 12/16/2023 Upcoming Appointment 12/28/24  Lyrica  75mg  Last Filled 10/19/24

## 2024-12-20 ENCOUNTER — Other Ambulatory Visit: Payer: Self-pay | Admitting: Neurology

## 2024-12-24 ENCOUNTER — Other Ambulatory Visit: Payer: Self-pay | Admitting: Neurology

## 2024-12-28 ENCOUNTER — Ambulatory Visit: Admitting: Neurology
